# Patient Record
Sex: Female | Born: 1965 | Race: Black or African American | Hispanic: No | Marital: Single | Smoking: Never smoker
Health system: Southern US, Community
[De-identification: ages and names within clinical notes are randomized; demographics above are authoritative.]

## PROBLEM LIST (undated history)

## (undated) DIAGNOSIS — E119 Type 2 diabetes mellitus without complications: Secondary | ICD-10-CM

## (undated) DIAGNOSIS — I10 Essential (primary) hypertension: Secondary | ICD-10-CM

## (undated) HISTORY — PX: ELBOW SURGERY: SHX618

## (undated) HISTORY — PX: CARPAL TUNNEL RELEASE: SHX101

## (undated) HISTORY — PX: FINGER SURGERY: SHX640

---

## 2013-03-11 DIAGNOSIS — E119 Type 2 diabetes mellitus without complications: Secondary | ICD-10-CM | POA: Insufficient documentation

## 2013-03-11 DIAGNOSIS — L409 Psoriasis, unspecified: Secondary | ICD-10-CM | POA: Insufficient documentation

## 2013-03-11 DIAGNOSIS — I10 Essential (primary) hypertension: Secondary | ICD-10-CM | POA: Insufficient documentation

## 2016-04-19 ENCOUNTER — Other Ambulatory Visit: Payer: Self-pay | Admitting: Orthopedic Surgery

## 2016-04-19 DIAGNOSIS — M25561 Pain in right knee: Secondary | ICD-10-CM

## 2016-04-19 DIAGNOSIS — M2391 Unspecified internal derangement of right knee: Secondary | ICD-10-CM

## 2016-05-03 ENCOUNTER — Ambulatory Visit
Admission: RE | Admit: 2016-05-03 | Discharge: 2016-05-03 | Disposition: A | Discharge status: Home / Self Care | Payer: Self-pay | Source: Ambulatory Visit | Attending: Orthopedic Surgery | Admitting: Orthopedic Surgery

## 2016-05-03 ENCOUNTER — Encounter: Payer: Self-pay | Admitting: Radiology

## 2016-05-03 DIAGNOSIS — M94261 Chondromalacia, right knee: Secondary | ICD-10-CM | POA: Insufficient documentation

## 2016-05-03 DIAGNOSIS — M25561 Pain in right knee: Secondary | ICD-10-CM | POA: Insufficient documentation

## 2016-05-03 DIAGNOSIS — M2391 Unspecified internal derangement of right knee: Secondary | ICD-10-CM

## 2016-05-13 ENCOUNTER — Emergency Department
Admission: EM | Admit: 2016-05-13 | Discharge: 2016-05-13 | Disposition: A | Discharge status: Home / Self Care | Payer: Self-pay | Attending: Emergency Medicine | Admitting: Emergency Medicine

## 2016-05-13 ENCOUNTER — Encounter: Payer: Self-pay | Admitting: Emergency Medicine

## 2016-05-13 ENCOUNTER — Emergency Department: Payer: Self-pay

## 2016-05-13 DIAGNOSIS — E119 Type 2 diabetes mellitus without complications: Secondary | ICD-10-CM | POA: Insufficient documentation

## 2016-05-13 DIAGNOSIS — R519 Headache, unspecified: Secondary | ICD-10-CM

## 2016-05-13 DIAGNOSIS — R51 Headache: Secondary | ICD-10-CM

## 2016-05-13 DIAGNOSIS — I10 Essential (primary) hypertension: Secondary | ICD-10-CM | POA: Insufficient documentation

## 2016-05-13 DIAGNOSIS — G4453 Primary thunderclap headache: Secondary | ICD-10-CM | POA: Insufficient documentation

## 2016-05-13 HISTORY — DX: Essential (primary) hypertension: I10

## 2016-05-13 HISTORY — DX: Type 2 diabetes mellitus without complications: E11.9

## 2016-05-13 LAB — CSF CELL COUNT WITH DIFFERENTIAL
EOS CSF: 0 %
Eosinophils, CSF: 0 %
LYMPHS CSF: 96 %
Lymphs, CSF: 89 %
Monocyte-Macrophage-Spinal Fluid: 11 %
Monocyte-Macrophage-Spinal Fluid: 4 %
Other Cells, CSF: 0
Other Cells, CSF: 0
RBC COUNT CSF: 2 /mm3 (ref 0–3)
RBC Count, CSF: 0 /mm3 (ref 0–3)
SEGMENTED NEUTROPHILS-CSF: 0 %
SEGMENTED NEUTROPHILS-CSF: 0 %
Tube #: 1
Tube #: 4
WBC CSF: 0 /mm3 (ref 0–5)
WBC, CSF: 0 /mm3 (ref 0–5)

## 2016-05-13 LAB — GRAM STAIN: GRAM STAIN: NONE SEEN

## 2016-05-13 LAB — BASIC METABOLIC PANEL
ANION GAP: 9 (ref 5–15)
BUN: 12 mg/dL (ref 6–20)
CALCIUM: 9.4 mg/dL (ref 8.9–10.3)
CO2: 26 mmol/L (ref 22–32)
Chloride: 99 mmol/L — ABNORMAL LOW (ref 101–111)
Creatinine, Ser: 0.83 mg/dL (ref 0.44–1.00)
GFR calc Af Amer: >60 mL/min (ref >60)
GFR calc non Af Amer: >60 mL/min (ref >60)
GLUCOSE: 302 mg/dL — AB (ref 65–99)
Potassium: 4 mmol/L (ref 3.5–5.1)
Sodium: 134 mmol/L — ABNORMAL LOW (ref 135–145)

## 2016-05-13 LAB — CBC WITH DIFFERENTIAL/PLATELET
BASOS ABS: 0 10*3/uL (ref 0–0.1)
Basophils Relative: 0 %
Eosinophils Absolute: 0.2 10*3/uL (ref 0–0.7)
Eosinophils Relative: 3 %
HEMATOCRIT: 47.8 % — AB (ref 35.0–47.0)
Hemoglobin: 15.8 g/dL (ref 12.0–16.0)
LYMPHS ABS: 3.1 10*3/uL (ref 1.0–3.6)
LYMPHS PCT: 44 %
MCH: 27.4 pg (ref 26.0–34.0)
MCHC: 33 g/dL (ref 32.0–36.0)
MCV: 83 fL (ref 80.0–100.0)
MONO ABS: 0.5 10*3/uL (ref 0.2–0.9)
MONOS PCT: 8 %
NEUTROS ABS: 3.1 10*3/uL (ref 1.4–6.5)
Neutrophils Relative %: 45 %
Platelets: 292 10*3/uL (ref 150–440)
RBC: 5.76 MIL/uL — ABNORMAL HIGH (ref 3.80–5.20)
RDW: 13.7 % (ref 11.5–14.5)
WBC: 7 10*3/uL (ref 3.6–11.0)

## 2016-05-13 LAB — PROTEIN AND GLUCOSE, CSF
Glucose, CSF: 144 mg/dL — ABNORMAL HIGH (ref 40–70)
Total  Protein, CSF: 32 mg/dL (ref 15–45)

## 2016-05-13 MED ORDER — ONDANSETRON 4 MG PO TBDP
ORAL_TABLET | ORAL | Status: AC
  Filled 2016-05-13: qty 1

## 2016-05-13 MED ORDER — LIDOCAINE-EPINEPHRINE (PF) 1 %-1:200000 IJ SOLN
INTRAMUSCULAR | Status: AC
  Filled 2016-05-13: qty 30

## 2016-05-13 MED ORDER — BUTALBITAL-APAP-CAFFEINE 50-325-40 MG PO TABS
2.0000 | ORAL_TABLET | Freq: Once | ORAL | Status: AC
  Administered 2016-05-13: 2 via ORAL
  Filled 2016-05-13: qty 2

## 2016-05-13 MED ORDER — BUTALBITAL-APAP-CAFFEINE 50-325-40 MG PO TABS: 1.0000 | ORAL_TABLET | Freq: Four times a day (QID) | ORAL | 0 refills | Status: AC | PRN

## 2016-05-13 MED ORDER — ONDANSETRON 4 MG PO TBDP: 4.0000 mg | ORAL_TABLET | Freq: Once | ORAL | Status: DC

## 2016-05-13 MED ORDER — MORPHINE SULFATE (PF) 2 MG/ML IV SOLN
4.0000 mg | Freq: Once | INTRAVENOUS | Status: AC
  Administered 2016-05-13: 4 mg via INTRAVENOUS
  Filled 2016-05-13: qty 2

## 2016-05-13 MED ORDER — SODIUM CHLORIDE 0.9 % IV BOLUS (SEPSIS)
1000.0000 mL | Freq: Once | INTRAVENOUS | Status: AC
  Administered 2016-05-13: 1000 mL via INTRAVENOUS

## 2016-05-13 MED ORDER — METOCLOPRAMIDE HCL 5 MG/ML IJ SOLN
10.0000 mg | Freq: Once | INTRAMUSCULAR | Status: AC
  Administered 2016-05-13: 10 mg via INTRAVENOUS
  Filled 2016-05-13: qty 2

## 2016-05-13 NOTE — Progress Notes (Signed)
Chaplain rounded the unit to provide a compassionate presence and spiritual support to the patient and family.Family Dollar StoresChaplain Ameera Tigue [phone]

## 2016-05-13 NOTE — ED Provider Notes (Signed)
-----------------------------------------   8:10 PM on 05/13/2016 -----------------------------------------  Patient's CSF results are largely within normal limits. Patient's MRI/MRA/MRV is also normal. We will discharge the patient home with Fioricet to be taken as needed for headaches. We'll also have the patient follow up with neurology for further evaluation given her chronic headaches. Patient is agreeable to this plan.   Minna AntisKevin Tymeer Vaquera, MD 05/13/16 2011

## 2016-05-13 NOTE — ED Notes (Signed)
Patient observed resting in bed with NAD. Visitor at bedside. Patient awaiting MRI results at this time. Patient requesting to eat, however MD has not cleared her to do so at this point. Will follow up when results returned and/or disposition is decided upon. Patient with no verbalized needs at this time. Will continue to monitor.

## 2016-05-13 NOTE — ED Triage Notes (Signed)
Pt to ed with c/o headache that started after she was sitting on toilet and straining for a bm.  Pt reports pain in back of head.

## 2016-05-13 NOTE — ED Notes (Signed)

## 2016-05-13 NOTE — ED Notes (Signed)
RN to bedside to assist patient to in room toilet. Patient continues to report slight headache; continues to be slightly vertiginous with position changes. Assisted back into bed and positioned for comfort. No further verbalized needs at this time. Chaplain in to see patient at this time. MRI results just returned; MD made aware; Dr. Lenard LancePaduchowski will review shortly; patient updated. Will continue to monitor.

## 2016-05-13 NOTE — ED Notes (Signed)
Dr. Lenard LancePaduchowski in to speak with patient regarding results of MRI and POC as it stands at this point.

## 2016-05-13 NOTE — ED Provider Notes (Signed)
[facility] Emergency Department Provider Note  ____________________________________________  Time seen: Approximately 12:36 PM  I have reviewed the triage vital signs and the nursing notes.   HISTORY  Chief Complaint Headache   HPI Caitlyn Walton is a 50 y.o. female with a history of hypertension and diabetes who presents for evaluation of thunderclap headache. 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 This has happened twice in the last week. This morning she was straining in the toilet when she developed a 10 out of 10 thunderclap occipital headache. She felt she was going to pass out, felt very nauseous. She reports these headaches usually resolve within an hour without any medications however the one today did not get better. This happened around 8AM this morning. Patient reports a remote history of migraine headaches however there were never occipital in nature. She denies blurry vision, facial droop, in the lateral weakness or numbness, gait instability, slurred speech, difficulty finding words. She does have a family history of stroke in her mother. No personal or family history of AVM or aneurysms. She reports that she took 1 Excedrin at home and currently her pain is moderate. She continues to have severe nausea.   Past Medical History:  Diagnosis Date  . Diabetes mellitus without complication (HCC)   . Hypertension     There are no active problems to display for this patient.   History reviewed. No pertinent surgical history.  Prior to Admission medications   Not on File    Allergies Aleve [naproxen sodium]  History reviewed. No pertinent family history.  Social History Social History  Substance Use Topics  . Smoking status: Never Smoker  . Smokeless tobacco: Never Used  . Alcohol use Yes    Review of Systems  Constitutional: Negative for  fever. + lightheadedness Eyes: Negative for visual changes. ENT: Negative for sore throat. Cardiovascular: Negative for chest pain. Respiratory: Negative for shortness of breath. Gastrointestinal: Negative for abdominal pain, vomiting or diarrhea. + nausea Genitourinary: Negative for dysuria. Musculoskeletal: Negative for back pain. Skin: Negative for rash. Neurological: Negative for weakness or numbness. + HA  ____________________________________________   PHYSICAL EXAM:  VITAL SIGNS: ED Triage Vitals [05/13/16 0929]  Enc Vitals Group     BP (!) 142/72     Pulse      Resp 20     Temp 97.6 F (36.4 C)     Temp Source Oral     SpO2      Weight 207 lb (93.9 kg)     Height 5\' 4"  (1.626 m)     Head Circumference      Peak Flow      Pain Score 8     Pain Loc      Pain Edu?      Excl. in GC?     Constitutional: Alert and oriented. Well appearing and in no apparent distress. HEENT:      Head: Normocephalic and atraumatic.         Eyes: Conjunctivae are normal. Sclera is non-icteric. EOMI. PERRL      Mouth/Throat: Mucous membranes are moist.       Neck: Supple with no signs of meningismus. Cardiovascular: Regular rate and rhythm. No murmurs, gallops, or rubs. 2+ symmetrical distal pulses are present in all extremities. No JVD. Respiratory: Normal respiratory effort. Lungs are clear to auscultation bilaterally. No wheezes, crackles, or rhonchi.  Gastrointestinal: Soft, non tender, and non distended with positive bowel sounds. No rebound or guarding. Musculoskeletal: Nontender with normal range of motion in all extremities. No edema, cyanosis, or erythema of extremities. Neurologic: Normal speech and language. A & O x3, PERRL, no nystagmus, CN II-XII intact, motor testing reveals good tone and bulk throughout. There is no evidence of pronator drift or dysmetria. Muscle strength is 5/5 throughout. Deep tendon reflexes are 2+ throughout with downgoing toes. Sensory examination is  intact. Gait is normal. Skin: Skin is warm, dry and intact. No rash noted. Psychiatric: Mood and affect are normal. Speech and behavior are normal.  ____________________________________________   LABS (all labs ordered are listed, but only abnormal results are displayed)  Labs Reviewed  CBC WITH DIFFERENTIAL/PLATELET - Abnormal; Notable for the following:       Result Value   RBC 5.76 (*)    HCT 47.8 (*)    All other components within normal limits  BASIC METABOLIC PANEL - Abnormal; Notable for the following:    Sodium 134 (*)    Chloride 99 (*)    Glucose, Bld 302 (*)    All other components within normal limits  CSF CULTURE  GRAM STAIN  CSF CELL COUNT WITH DIFFERENTIAL  CSF CELL COUNT WITH DIFFERENTIAL  PROTEIN AND GLUCOSE, CSF   ____________________________________________  EKG  none  ____________________________________________  RADIOLOGY  Head CT: negative  ____________________________________________   PROCEDURES  Procedure(s) performed:yes .Lumbar Puncture Date/Time: 05/13/2016 2:48 PM Performed by: Nita SickleVERONESE, [place] Authorized by: Nita SickleVERONESE, [place]   Consent:    Consent obtained:  Written   Consent given by:  Patient   Risks discussed:  Bleeding, headache, nerve damage, infection, pain and repeat procedure Pre-procedure details:    Procedure purpose:  Diagnostic   Preparation: Patient was prepped and draped in usual sterile fashion   Anesthesia (see MAR for exact dosages):    Anesthesia method:  Local infiltration   Local anesthetic:  Lidocaine 1% WITH epi Procedure details:    Lumbar space:  L4-L5 interspace   Patient position:  R lateral decubitus   Needle gauge:  22   Needle type:  Spinal needle - Quincke tip   Number of attempts:  3   Fluid appearance:  Clear   Tubes of fluid:  4 Post-procedure:    Puncture site:  Adhesive bandage applied   Patient tolerance of procedure:  Tolerated well, no immediate complications   Critical Care  performed:  None ____________________________________________   INITIAL IMPRESSION / ASSESSMENT AND PLAN / ED COURSE   50 y.o. female with a history of hypertension and diabetes who presents for evaluation of thunderclap headache while straining in the toilet at 8AM. Patient fell dizzy, nauseous and felt like she was going to pass out. Patient's third episode this week of thunderclap HA with exertion. No neuro deficits noted during the onset of the pain or now in the ED. Patient reports HA is improved with excedrin she took at home but still has nausea. CT head is negative however story is very concerning for possible sentinel bleeds. I discussed risks and benefits of a spinal tap with the negative CT within 6 hours of onset of symptoms and patient would like to pursue an LP at this time. We'll get basic blood work, give her IV fluids and IV morphine for the headache, give her IV Reglan.  Clinical Course   LP performed per procedure note above. CSF studies pending. If CSF negative for blood, plan for MRV to  rule out CVT. If positive for blood will get MRI and MRV. Care transferred to Dr. Lenard Lance.  Pertinent labs & imaging results that were available during my care of the patient were reviewed by me and considered in my medical decision making (see chart for details).    ____________________________________________   FINAL CLINICAL IMPRESSION(S) / ED DIAGNOSES  Final diagnoses:  Thunderclap headache      NEW MEDICATIONS STARTED DURING THIS VISIT:  New Prescriptions   No medications on file     Note:  This document was prepared using Dragon voice recognition software and may include unintentional dictation errors.    Nita Sickle, MD 05/13/16 1450

## 2016-05-17 LAB — CSF CULTURE W GRAM STAIN
Culture: NO GROWTH
Gram Stain: NONE SEEN

## 2016-05-17 LAB — PATHOLOGIST SMEAR REVIEW

## 2016-05-17 LAB — CSF CULTURE

## 2017-03-23 ENCOUNTER — Encounter: Payer: Self-pay | Admitting: Emergency Medicine

## 2017-03-23 ENCOUNTER — Emergency Department
Admission: EM | Admit: 2017-03-23 | Discharge: 2017-03-23 | Disposition: A | Discharge status: Home / Self Care | Payer: 59 | Attending: Emergency Medicine | Admitting: Emergency Medicine

## 2017-03-23 ENCOUNTER — Emergency Department: Payer: 59

## 2017-03-23 DIAGNOSIS — Z794 Long term (current) use of insulin: Secondary | ICD-10-CM | POA: Diagnosis not present

## 2017-03-23 DIAGNOSIS — R079 Chest pain, unspecified: Secondary | ICD-10-CM | POA: Insufficient documentation

## 2017-03-23 DIAGNOSIS — E119 Type 2 diabetes mellitus without complications: Secondary | ICD-10-CM | POA: Diagnosis not present

## 2017-03-23 DIAGNOSIS — I10 Essential (primary) hypertension: Secondary | ICD-10-CM | POA: Insufficient documentation

## 2017-03-23 DIAGNOSIS — Z91040 Latex allergy status: Secondary | ICD-10-CM | POA: Insufficient documentation

## 2017-03-23 DIAGNOSIS — R6884 Jaw pain: Secondary | ICD-10-CM | POA: Diagnosis present

## 2017-03-23 DIAGNOSIS — M549 Dorsalgia, unspecified: Secondary | ICD-10-CM | POA: Diagnosis not present

## 2017-03-23 LAB — TROPONIN I: Troponin I: <0.03 ng/mL (ref <0.03)

## 2017-03-23 LAB — CBC
HEMATOCRIT: 43.2 % (ref 35.0–47.0)
Hemoglobin: 14.4 g/dL (ref 12.0–16.0)
MCH: 27.3 pg (ref 26.0–34.0)
MCHC: 33.5 g/dL (ref 32.0–36.0)
MCV: 81.6 fL (ref 80.0–100.0)
PLATELETS: 306 10*3/uL (ref 150–440)
RBC: 5.29 MIL/uL — AB (ref 3.80–5.20)
RDW: 13.6 % (ref 11.5–14.5)
WBC: 8.9 10*3/uL (ref 3.6–11.0)

## 2017-03-23 LAB — BASIC METABOLIC PANEL
ANION GAP: 9 (ref 5–15)
BUN: 11 mg/dL (ref 6–20)
CHLORIDE: 100 mmol/L — AB (ref 101–111)
CO2: 26 mmol/L (ref 22–32)
Calcium: 9.2 mg/dL (ref 8.9–10.3)
Creatinine, Ser: 0.74 mg/dL (ref 0.44–1.00)
GFR calc Af Amer: >60 mL/min (ref >60)
GFR calc non Af Amer: >60 mL/min (ref >60)
Glucose, Bld: 301 mg/dL — ABNORMAL HIGH (ref 65–99)
POTASSIUM: 3.6 mmol/L (ref 3.5–5.1)
SODIUM: 135 mmol/L (ref 135–145)

## 2017-03-23 MED ORDER — IOPAMIDOL (ISOVUE-370) INJECTION 76%
100.0000 mL | Freq: Once | INTRAVENOUS | Status: AC | PRN
  Administered 2017-03-23: 100 mL via INTRAVENOUS

## 2017-03-23 NOTE — ED Notes (Signed)
Patient transported to CT 

## 2017-03-23 NOTE — ED Notes (Signed)
ED Provider at bedside. 

## 2017-03-23 NOTE — ED Provider Notes (Signed)
[facility] Emergency Department Provider Note   ____________________________________________    I have reviewed the triage vital signs and the nursing notes.   HISTORY  Chief Complaint Jaw Pain; Back Pain; and Chest Pain     HPI Caitlyn Walton is a 51 y.o. female who presents with left-sided jaw pain for several days. This morning when she woke up she felt tightness across her shoulders bilaterally as well she became anxious and also had approximately 15-20 minutes of chest tightness. She reports her chest tightness has resolved now. No shortness of breath. No history of the same. She did not take anything for this. No diaphoresis nausea or vomiting. She does have a history of diabetes. She reports she thinks her cholesterol is normal. She does not smoke.   Past Medical History:  Diagnosis Date  . Diabetes mellitus without complication (HCC)   . Hypertension     There are no active problems to display for this patient.   History reviewed. No pertinent surgical history.  Prior to Admission medications   Medication Sig Start Date End Date Taking? Authorizing Provider  glipiZIDE (GLUCOTROL XL) 10 MG 24 hr tablet Take 20 mg by mouth daily. 02/12/17  Yes [provider]  hydrochlorothiazide (HYDRODIURIL) 25 MG tablet Take 25 mg by mouth daily. 01/30/17  Yes [provider]  Insulin Glargine (BASAGLAR KWIKPEN [place]) Inject 40 Units into the skin at bedtime.   Yes [provider]  losartan (COZAAR) 100 MG tablet Take 100 mg by mouth daily. 01/30/17  Yes [provider]  butalbital-acetaminophen-caffeine (FIORICET, ESGIC) 50-325-40 MG tablet Take 1-2 tablets by mouth every 6 (six) hours as needed for headache. Patient not taking: Reported on 03/23/2017 05/13/16 05/13/17  Minna AntisPaduchowski, Kevin, MD     Allergies Aleve [naproxen sodium] and Latex  No family history on file.  Social History Social History  Substance Use  Topics  . Smoking status: Never Smoker  . Smokeless tobacco: Never Used  . Alcohol use Yes    Review of Systems  Constitutional: No fever/chills Eyes: No visual changes.  ENT: As above Cardiovascular:As above Respiratory: Denies shortness of breath. Gastrointestinal: No abdominal pain.  No nausea, no vomiting.   Genitourinary: Negative for dysuria. Musculoskeletal: Negative for back pain. Skin: Negative for rash. Neurological: Negative for headaches    ____________________________________________   PHYSICAL EXAM:  VITAL SIGNS: ED Triage Vitals  Enc Vitals Group     BP 03/23/17 0838 121/80     Pulse Rate 03/23/17 0851 81     Resp 03/23/17 0838 20     Temp 03/23/17 0838 97.9 F (36.6 C)     Temp Source 03/23/17 0838 Oral     SpO2 03/23/17 0838 98 %     Weight --      Height --      Head Circumference --      Peak Flow --      Pain Score 03/23/17 0838 4     Pain Loc --      Pain Edu? --      Excl. in GC? --     Constitutional: Alert and oriented. No acute distress. Pleasant and interactive Eyes: Conjunctivae are normal.   Nose: No congestion/rhinnorhea. Mouth/Throat: Mucous membranes are moist. Normal intraoral exam, normal jaw exam, no swollen glands or tenderness.  Cardiovascular: Normal rate, regular rhythm. Grossly normal heart sounds.  Good peripheral circulation. Respiratory: Normal respiratory effort.  No retractions. Lungs CTAB. Gastrointestinal: Soft and nontender. No  distention.  No CVA tenderness. Genitourinary: deferred Musculoskeletal:.  Warm and well perfused Neurologic:  Normal speech and language. No gross focal neurologic deficits are appreciated.  Skin:  Skin is warm, dry and intact. No rash noted. Psychiatric: Mood and affect are normal. Speech and behavior are normal.  ____________________________________________   LABS (all labs ordered are listed, but only abnormal results are displayed)  Labs Reviewed  BASIC METABOLIC PANEL -  Abnormal; Notable for the following:       Result Value   Chloride 100 (*)    Glucose, Bld 301 (*)    All other components within normal limits  CBC - Abnormal; Notable for the following:    RBC 5.29 (*)    All other components within normal limits  TROPONIN I   ____________________________________________  EKG  ED ECG REPORT I, Jene Every, the attending physician, personally viewed and interpreted this ECG.  Date: 03/23/2017  Rhythm: normal sinus rhythm QRS Axis: normal Intervals: normal ST/T Wave abnormalities: normal Narrative Interpretation: no evidence of acute ischemia  ____________________________________________  RADIOLOGY  Chest x-ray unremarkable, CT angiography unremarkable ____________________________________________   PROCEDURES  Procedure(s) performed: No    Critical Care performed:No ____________________________________________   INITIAL IMPRESSION / ASSESSMENT AND PLAN / ED COURSE  Pertinent labs & imaging results that were available during my care of the patient were reviewed by me and considered in my medical decision making (see chart for details).  Patient well appearing with reassuring labs. EKG was unremarkable. Chest x-ray was normal, CT angiography was performed given her description of jaw chest and back discomfort that was normal. Given that her chest pain only lasted 10-15 minutes and she feels quite well now with normal labs and a reassuring workup feel outpatient follow-up with cardiology as appropriate. Patient agrees to this plan. She knows to return if any recurrence of her pain.    ____________________________________________   FINAL CLINICAL IMPRESSION(S) / ED DIAGNOSES  Final diagnoses:  Chest pain, unspecified type      NEW MEDICATIONS STARTED DURING THIS VISIT:  Discharge Medication List as of 03/23/2017 11:46 AM       Note:  This document was prepared using Dragon voice recognition software and may include  unintentional dictation errors.    Jene Every, MD 03/23/17 [phone]

## 2017-03-23 NOTE — ED Triage Notes (Signed)
Pt with chest discomfort, mid back pain and jaw pain.

## 2017-05-08 ENCOUNTER — Other Ambulatory Visit: Payer: Self-pay | Admitting: Obstetrics and Gynecology

## 2017-05-08 DIAGNOSIS — Z1231 Encounter for screening mammogram for malignant neoplasm of breast: Secondary | ICD-10-CM

## 2017-06-05 ENCOUNTER — Ambulatory Visit
Admission: RE | Admit: 2017-06-05 | Discharge: 2017-06-05 | Disposition: A | Discharge status: Home / Self Care | Payer: 59 | Source: Ambulatory Visit | Attending: Obstetrics and Gynecology | Admitting: Obstetrics and Gynecology

## 2017-06-05 DIAGNOSIS — Z1231 Encounter for screening mammogram for malignant neoplasm of breast: Secondary | ICD-10-CM | POA: Diagnosis not present

## 2017-09-14 ENCOUNTER — Encounter: Payer: Self-pay | Admitting: Emergency Medicine

## 2017-09-14 ENCOUNTER — Emergency Department
Admission: EM | Admit: 2017-09-14 | Discharge: 2017-09-14 | Disposition: A | Discharge status: Home / Self Care | Payer: 59 | Attending: Emergency Medicine | Admitting: Emergency Medicine

## 2017-09-14 ENCOUNTER — Emergency Department: Payer: 59

## 2017-09-14 ENCOUNTER — Other Ambulatory Visit: Payer: Self-pay

## 2017-09-14 DIAGNOSIS — R110 Nausea: Secondary | ICD-10-CM | POA: Insufficient documentation

## 2017-09-14 DIAGNOSIS — R197 Diarrhea, unspecified: Secondary | ICD-10-CM | POA: Insufficient documentation

## 2017-09-14 DIAGNOSIS — I10 Essential (primary) hypertension: Secondary | ICD-10-CM | POA: Insufficient documentation

## 2017-09-14 DIAGNOSIS — K5900 Constipation, unspecified: Secondary | ICD-10-CM

## 2017-09-14 DIAGNOSIS — R1031 Right lower quadrant pain: Secondary | ICD-10-CM | POA: Diagnosis not present

## 2017-09-14 DIAGNOSIS — Z794 Long term (current) use of insulin: Secondary | ICD-10-CM | POA: Insufficient documentation

## 2017-09-14 DIAGNOSIS — Z79899 Other long term (current) drug therapy: Secondary | ICD-10-CM | POA: Insufficient documentation

## 2017-09-14 DIAGNOSIS — E119 Type 2 diabetes mellitus without complications: Secondary | ICD-10-CM | POA: Diagnosis not present

## 2017-09-14 DIAGNOSIS — R1030 Lower abdominal pain, unspecified: Secondary | ICD-10-CM

## 2017-09-14 LAB — URINALYSIS, COMPLETE (UACMP) WITH MICROSCOPIC
Bacteria, UA: NONE SEEN
Bilirubin Urine: NEGATIVE
HGB URINE DIPSTICK: NEGATIVE
Ketones, ur: NEGATIVE mg/dL
Leukocytes, UA: NEGATIVE
NITRITE: NEGATIVE
PH: 5 (ref 5.0–8.0)
PROTEIN: NEGATIVE mg/dL
SPECIFIC GRAVITY, URINE: 1.033 — AB (ref 1.005–1.030)

## 2017-09-14 LAB — COMPREHENSIVE METABOLIC PANEL
ALBUMIN: 3.8 g/dL (ref 3.5–5.0)
ALT: 22 U/L (ref 14–54)
ANION GAP: 10 (ref 5–15)
AST: 33 U/L (ref 15–41)
Alkaline Phosphatase: 57 U/L (ref 38–126)
BILIRUBIN TOTAL: 0.6 mg/dL (ref 0.3–1.2)
BUN: 12 mg/dL (ref 6–20)
CO2: 23 mmol/L (ref 22–32)
CREATININE: 0.71 mg/dL (ref 0.44–1.00)
Calcium: 8.9 mg/dL (ref 8.9–10.3)
Chloride: 103 mmol/L (ref 101–111)
GFR calc Af Amer: >60 mL/min (ref >60)
GFR calc non Af Amer: >60 mL/min (ref >60)
GLUCOSE: 385 mg/dL — AB (ref 65–99)
Potassium: 3.8 mmol/L (ref 3.5–5.1)
Sodium: 136 mmol/L (ref 135–145)
TOTAL PROTEIN: 6.7 g/dL (ref 6.5–8.1)

## 2017-09-14 LAB — CBC
HCT: 42.1 % (ref 35.0–47.0)
HEMOGLOBIN: 13.8 g/dL (ref 12.0–16.0)
MCH: 27.4 pg (ref 26.0–34.0)
MCHC: 32.7 g/dL (ref 32.0–36.0)
MCV: 83.8 fL (ref 80.0–100.0)
Platelets: 293 10*3/uL (ref 150–440)
RBC: 5.03 MIL/uL (ref 3.80–5.20)
RDW: 13.1 % (ref 11.5–14.5)
WBC: 9.7 10*3/uL (ref 3.6–11.0)

## 2017-09-14 LAB — LIPASE, BLOOD: Lipase: 45 U/L (ref 11–51)

## 2017-09-14 MED ORDER — POLYETHYLENE GLYCOL 3350 17 G PO PACK: 17.0000 g | PACK | Freq: Every day | ORAL | 0 refills | Status: AC | PRN

## 2017-09-14 MED ORDER — IOPAMIDOL (ISOVUE-300) INJECTION 61%
100.0000 mL | Freq: Once | INTRAVENOUS | Status: AC | PRN
  Administered 2017-09-14: 100 mL via INTRAVENOUS

## 2017-09-14 MED ORDER — ONDANSETRON HCL 4 MG/2ML IJ SOLN
4.0000 mg | Freq: Once | INTRAMUSCULAR | Status: AC
  Administered 2017-09-14: 4 mg via INTRAVENOUS
  Filled 2017-09-14: qty 2

## 2017-09-14 MED ORDER — SODIUM CHLORIDE 0.9 % IV BOLUS (SEPSIS)
1000.0000 mL | Freq: Once | INTRAVENOUS | Status: AC
  Administered 2017-09-14: 1000 mL via INTRAVENOUS

## 2017-09-14 NOTE — ED Notes (Signed)
Notified CT that patient is ready.

## 2017-09-14 NOTE — ED Provider Notes (Signed)
[facility] Emergency Department Provider Note  ____________________________________________   First MD Initiated Contact with Patient 09/14/17 [phone]     (approximate)  I have reviewed the triage vital signs and the nursing notes.   HISTORY  Chief Complaint Abdominal Pain   HPI Caitlyn Walton is a 52 y.o. female with a history of diabetes and hypertension was presenting to the emergency department with periumbilical pain that is radiating to the right lower quadrant.  Says the pain is been ongoing over the past week and is worsening gradually.  She says the pain is a 7 out of 10 now with sharp, shooting pain to the right lower quadrant.  Says that she has nausea but no vomiting.  Says that about a week ago just prior to this pain she had had diarrhea and now is having pellet-like stools but is able to pass stool and had a last bowel movement just this morning.  She says the pain is relieved with the passage of stool briefly but then quickly returns.  History of a C-section but without any other abdominal surgeries.  Past Medical History:  Diagnosis Date  . Diabetes mellitus without complication (HCC)   . Hypertension     There are no active problems to display for this patient.   Past Surgical History:  Procedure Laterality Date  . CARPAL TUNNEL RELEASE    . CESAREAN SECTION    . ELBOW SURGERY    . FINGER SURGERY      Prior to Admission medications   Medication Sig Start Date End Date Taking? Authorizing Provider  glipiZIDE (GLUCOTROL XL) 10 MG 24 hr tablet Take 20 mg by mouth daily. 02/12/17   [provider]  hydrochlorothiazide (HYDRODIURIL) 25 MG tablet Take 25 mg by mouth daily. 01/30/17   [provider]  Insulin Glargine (BASAGLAR KWIKPEN [place]) Inject 40 Units into the skin at bedtime.    [provider]  losartan (COZAAR) 100 MG tablet Take 100 mg by mouth daily. 01/30/17   [provider]     Allergies Aleve [naproxen sodium] and Latex  Family History  Problem Relation Age of Onset  . Breast cancer Neg Hx     Social History Social History   Tobacco Use  . Smoking status: Never Smoker  . Smokeless tobacco: Never Used  Substance Use Topics  . Alcohol use: Yes  . Drug use: No    Review of Systems  Constitutional: No fever/chills Eyes: No visual changes. ENT: No sore throat. Cardiovascular: Denies chest pain. Respiratory: Denies shortness of breath. Gastrointestinal: no vomiting.   Genitourinary: Negative for dysuria. Musculoskeletal: Negative for back pain. Skin: Negative for rash. Neurological: Negative for headaches, focal weakness or numbness.   ____________________________________________   PHYSICAL EXAM:  VITAL SIGNS: ED Triage Vitals  Enc Vitals Group     BP 09/14/17 0120 (!) 141/32     Pulse Rate 09/14/17 0120 88     Resp 09/14/17 0120 18     Temp 09/14/17 0120 98.4 F (36.9 C)     Temp Source 09/14/17 0120 Oral     SpO2 09/14/17 0120 97 %     Weight 09/14/17 0113 203 lb (92.1 kg)     Height 09/14/17 0113 5\' 4"  (1.626 m)     Head Circumference --      Peak Flow --      Pain Score 09/14/17 0113 0     Pain Loc --      Pain Edu? --  Excl. in GC? --     Constitutional: Alert and oriented. Well appearing and in no acute distress. Eyes: Conjunctivae are normal.  Head: Atraumatic. Nose: No congestion/rhinnorhea. Mouth/Throat: Mucous membranes are moist.  Neck: No stridor.   Cardiovascular: Normal rate, regular rhythm. Grossly normal heart sounds.  Respiratory: Normal respiratory effort.  No retractions. Lungs CTAB. Gastrointestinal: Soft with periumbilical as well as right lower quadrant tenderness without rebound or guarding.  Tenderness is moderate. No distention.  Musculoskeletal: No lower extremity tenderness nor edema.  No joint effusions. Neurologic:  Normal speech and language. No gross focal neurologic deficits are  appreciated. Skin:  Skin is warm, dry and intact. No rash noted. Psychiatric: Mood and affect are normal. Speech and behavior are normal.  ____________________________________________   LABS (all labs ordered are listed, but only abnormal results are displayed)  Labs Reviewed  COMPREHENSIVE METABOLIC PANEL - Abnormal; Notable for the following components:      Result Value   Glucose, Bld 385 (*)    All other components within normal limits  URINALYSIS, COMPLETE (UACMP) WITH MICROSCOPIC - Abnormal; Notable for the following components:   Color, Urine STRAW (*)    APPearance CLEAR (*)    Specific Gravity, Urine 1.033 (*)    Glucose, UA >=500 (*)    Squamous Epithelial / LPF 0-5 (*)    All other components within normal limits  LIPASE, BLOOD  CBC   ____________________________________________  EKG   ____________________________________________  RADIOLOGY  No intra-abdominal or pelvic finding on CAT scan ____________________________________________   PROCEDURES  Procedure(s) performed:   Procedures  Critical Care performed:   ____________________________________________   INITIAL IMPRESSION / ASSESSMENT AND PLAN / ED COURSE  Pertinent labs & imaging results that were available during my care of the patient were reviewed by me and considered in my medical decision making (see chart for details).  Differential diagnosis includes, but is not limited to, ovarian cyst, ovarian torsion, acute appendicitis, diverticulitis, urinary tract infection/pyelonephritis, endometriosis, bowel obstruction, colitis, renal colic, gastroenteritis, hernia, fibroids, endometriosis, pregnancy related pain including ectopic pregnancy, etc. As part of my medical decision making, I reviewed the following data within the electronic MEDICAL RECORD NUMBER Notes from prior ED visits  ----------------------------------------- 9:20 AM on 09/14/2017 -----------------------------------------  Patient  continues to rest comfortably at this time.  Reassuring CAT scan.  Possibly constipation causing her abdominal pain.  Will discharge her on MiraLAX.  She is understanding of the diagnosis as well as treatment plan and willing to comply.   ____________________________________________   FINAL CLINICAL IMPRESSION(S) / ED DIAGNOSES  Lower abdominal pain.  Constipation.    NEW MEDICATIONS STARTED DURING THIS VISIT:  New Prescriptions   No medications on file     Note:  This document was prepared using Dragon voice recognition software and may include unintentional dictation errors.     Myrna BlazerSchaevitz, David Matthew, MD 09/14/17 [phone]

## 2017-09-14 NOTE — ED Notes (Signed)

## 2017-09-14 NOTE — ED Triage Notes (Addendum)
Pt presents to ED with abd pain with nausea and difficulty having a bowel movement. Pt states her pain is "mostly around the belly button and sometimes shoots down" to her right lower abd. Denies vomiting. Onset approx 1 week ago but has gotten progressively worse.

## 2018-07-08 DIAGNOSIS — E1142 Type 2 diabetes mellitus with diabetic polyneuropathy: Secondary | ICD-10-CM | POA: Insufficient documentation

## 2018-07-08 DIAGNOSIS — Z6831 Body mass index (BMI) 31.0-31.9, adult: Secondary | ICD-10-CM | POA: Insufficient documentation

## 2018-09-02 IMAGING — MG MM DIGITAL SCREENING BILAT W/ CAD
6 series · 6 of 6 positions shown · non-contrast
Comparison: Previous exam(s).

CLINICAL DATA: Screening.

EXAM:
DIGITAL SCREENING BILATERAL MAMMOGRAM WITH CAD

[R CC (1 of 2)]
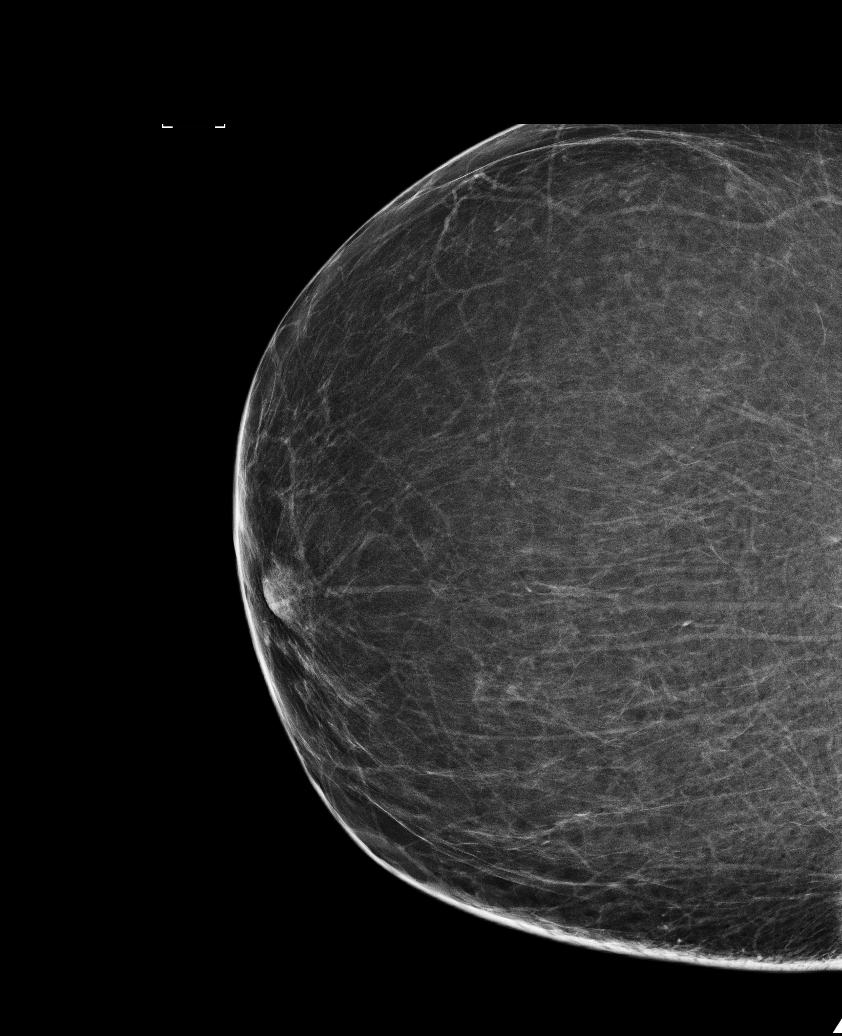

[R CC (2 of 2)]
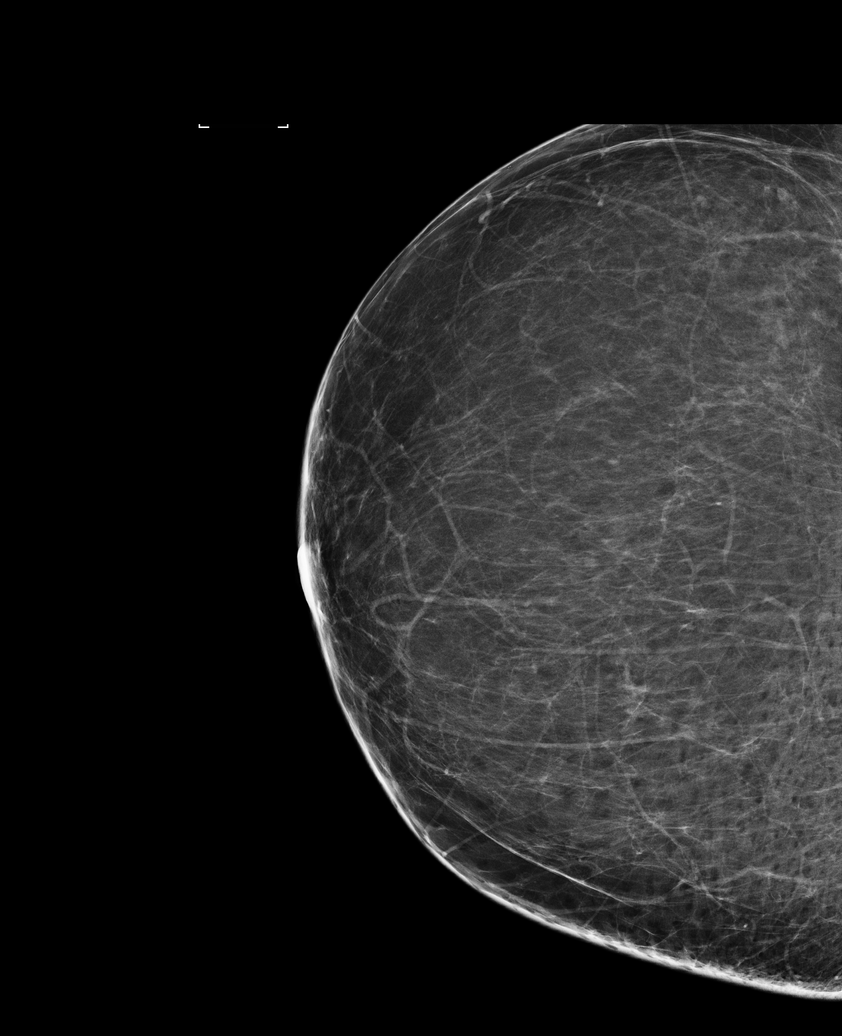

[L CC (1 of 2)]
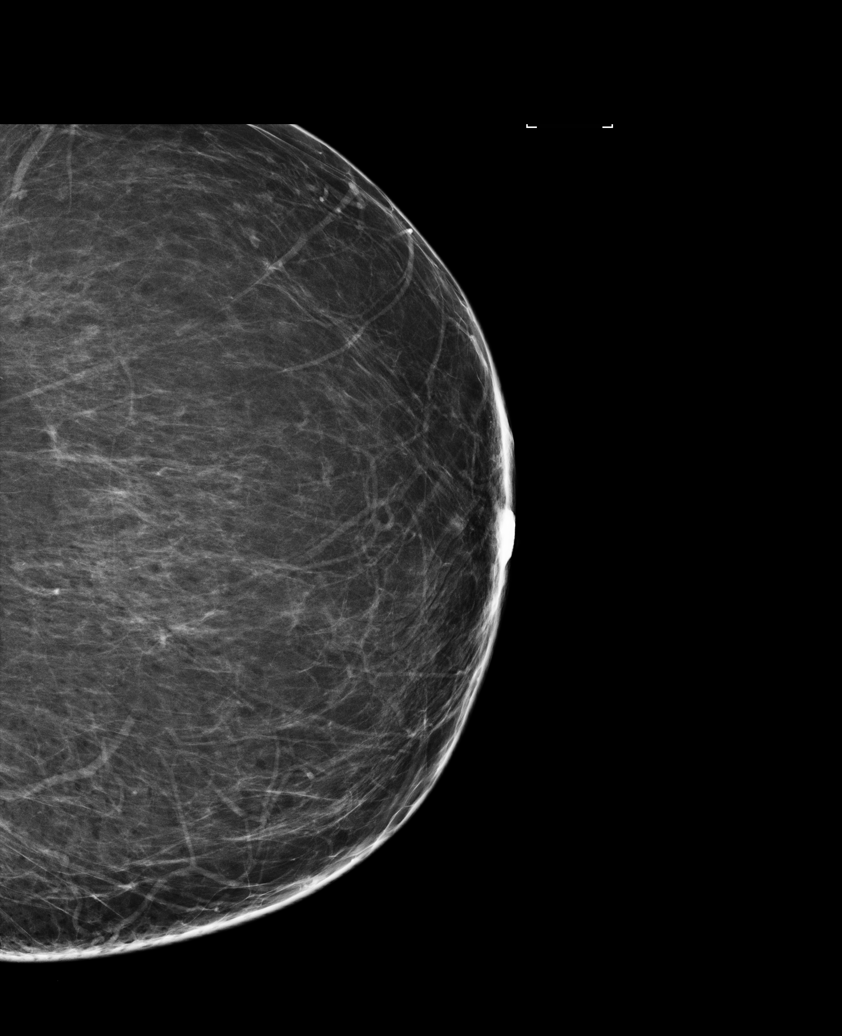

[L MLO]
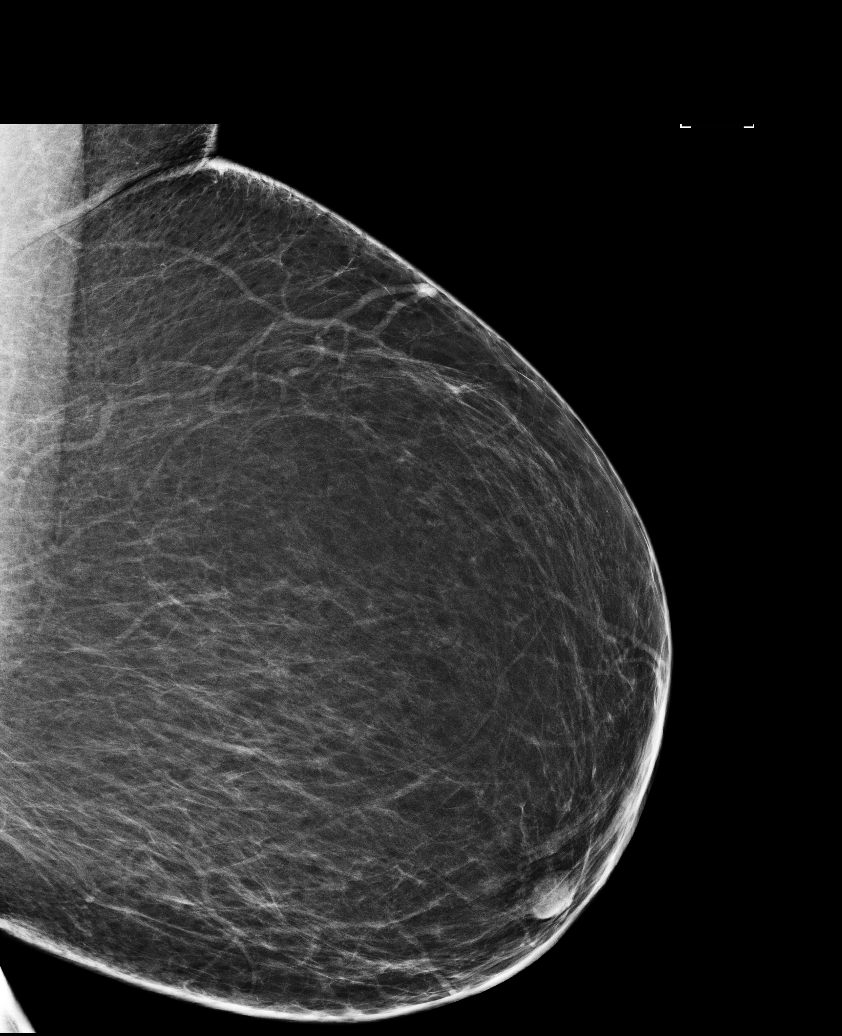

[L CC (2 of 2)]
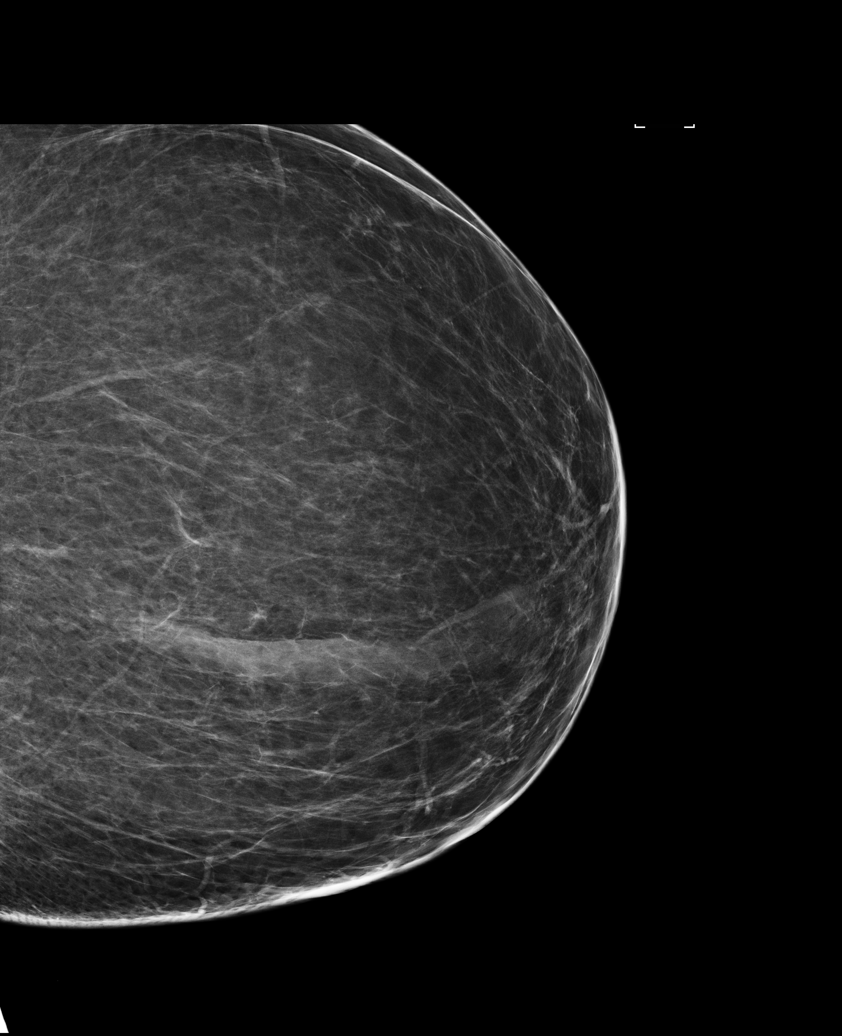

[R MLO]
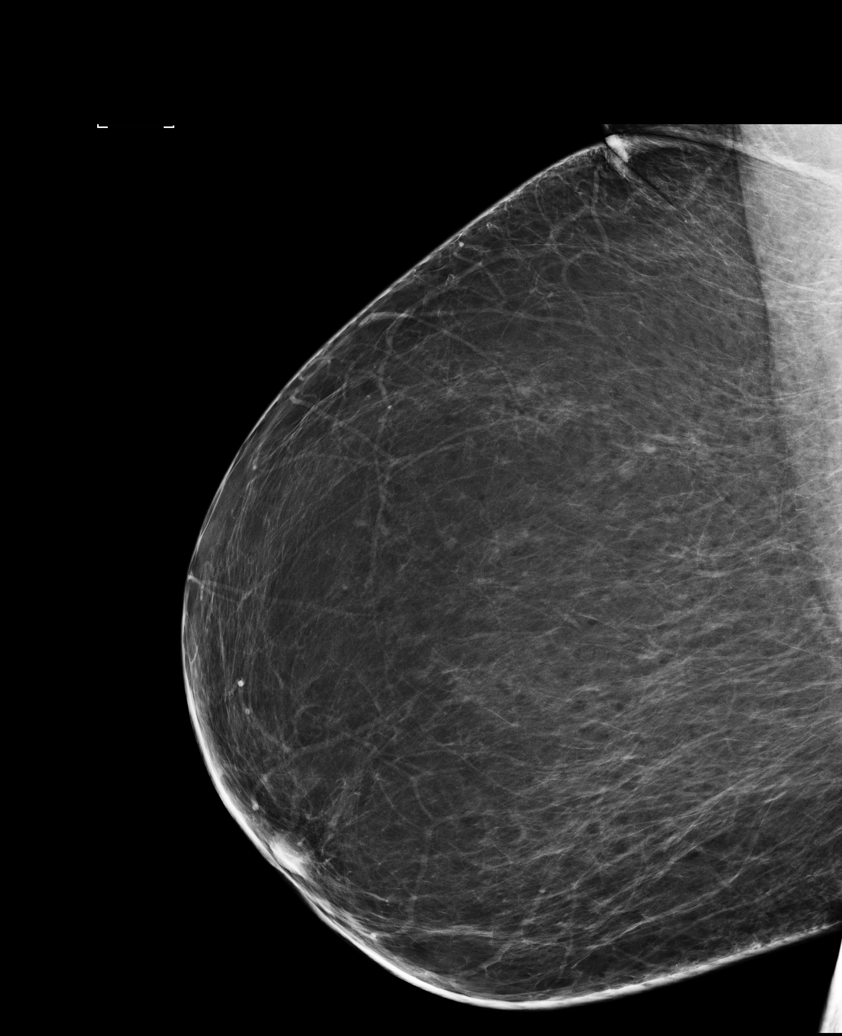

[6 of 6 positions shown; findings below may reference images not displayed]

ACR Breast Density Category b: There are scattered areas of
fibroglandular density.
FINDINGS: There are no findings suspicious for malignancy. Images were
processed with CAD.
IMPRESSION: No mammographic evidence of malignancy. A result letter of this
screening mammogram will be mailed directly to the patient.

RECOMMENDATION:
Screening mammogram in one year. (Code:AS-G-LCT)

BI-RADS CATEGORY  1: Negative.

## 2019-03-19 ENCOUNTER — Other Ambulatory Visit: Payer: Self-pay | Admitting: Certified Nurse Midwife

## 2019-03-19 DIAGNOSIS — Z1231 Encounter for screening mammogram for malignant neoplasm of breast: Secondary | ICD-10-CM

## 2019-04-07 ENCOUNTER — Ambulatory Visit
Admission: RE | Admit: 2019-04-07 | Discharge: 2019-04-07 | Disposition: A | Discharge status: Home / Self Care | Payer: 59 | Source: Ambulatory Visit | Attending: Certified Nurse Midwife | Admitting: Certified Nurse Midwife

## 2019-04-07 ENCOUNTER — Encounter (INDEPENDENT_AMBULATORY_CARE_PROVIDER_SITE_OTHER): Payer: Self-pay

## 2019-04-07 ENCOUNTER — Other Ambulatory Visit: Payer: Self-pay

## 2019-04-07 DIAGNOSIS — Z1231 Encounter for screening mammogram for malignant neoplasm of breast: Secondary | ICD-10-CM | POA: Diagnosis not present

## 2019-04-16 ENCOUNTER — Ambulatory Visit
Admission: EM | Admit: 2019-04-16 | Discharge: 2019-04-16 | Disposition: A | Discharge status: Home / Self Care | Payer: 59 | Attending: Urgent Care | Admitting: Urgent Care

## 2019-04-16 ENCOUNTER — Encounter: Payer: Self-pay | Admitting: Emergency Medicine

## 2019-04-16 ENCOUNTER — Other Ambulatory Visit: Payer: Self-pay

## 2019-04-16 DIAGNOSIS — H6690 Otitis media, unspecified, unspecified ear: Secondary | ICD-10-CM

## 2019-04-16 MED ORDER — FLUCONAZOLE 150 MG PO TABS: ORAL_TABLET | ORAL | 0 refills | Status: DC

## 2019-04-16 MED ORDER — AMOXICILLIN-POT CLAVULANATE 875-125 MG PO TABS: 1.0000 | ORAL_TABLET | Freq: Two times a day (BID) | ORAL | 0 refills | Status: AC

## 2019-04-16 NOTE — ED Provider Notes (Signed)
[place], [place]   Name: Caitlyn Walton DOB: Jun 07, 1966 MRN: [id number] CSN: [id number] PCP: Marlene Bastarey, Timothy S, MD  Arrival date and time:  04/16/19 0825  Chief Complaint:  Otalgia (APPT)   NOTE: Prior to seeing the patient today, I have reviewed the triage nursing documentation and vital signs. Clinical staff has updated patient's PMH/PSHx, current medication list, and drug allergies/intolerances to ensure comprehensive history available to assist in medical decision making.   History:   HPI: Caitlyn Walton is a 53 y.o. female who presents today with complaints of pain in her RIGHT ear. Pain began with acute onset about 3 days ago. She denies any associated fevers. Patient has not had any other recent upper respiratory symptoms; no cough, congestion, rhinorrhea, sneezing, or sore throat. She denies forceful nose blowing. Patient has not appreciated any otorrhea. She advises that her ability to hear from the RIGHT ear has acutely changed with the onset of the pain; describes hearing as being "like she is under water". Patient denies history of recurrent ear infections. She has never had tympanostomy tubes in the past. Patient advising that she has not been swimming in the recent past. Patient advises that her pain is generally worse in the morning after she has gone all night without taking anything for the pain. In efforts to conservatively manage her symptoms at home, the patient notes that she has used Aleve, which has helped to improve her symptoms.   Past Medical History:  Diagnosis Date  . Diabetes mellitus without complication (HCC)   . Hypertension     Past Surgical History:  Procedure Laterality Date  . CARPAL TUNNEL RELEASE    . CESAREAN SECTION    . ELBOW SURGERY    . FINGER SURGERY      Family History  Problem Relation Age of Onset  . Breast cancer Neg Hx     Social History   Tobacco Use  . Smoking status: Never Smoker  . Smokeless tobacco: Never Used   Substance Use Topics  . Alcohol use: Yes  . Drug use: No    There are no active problems to display for this patient.   Home Medications:    Current Meds  Medication Sig  . glipiZIDE (GLUCOTROL XL) 10 MG 24 hr tablet Take 20 mg by mouth daily.  . hydrochlorothiazide (HYDRODIURIL) 25 MG tablet Take 25 mg by mouth daily.  . Insulin Glargine (BASAGLAR KWIKPEN [place]) Inject 40 Units into the skin at bedtime.  Marland Kitchen. losartan (COZAAR) 100 MG tablet Take 100 mg by mouth daily.  . polyethylene glycol (MIRALAX) packet Take 17 g by mouth daily as needed for mild constipation or moderate constipation.    Allergies:   Aleve [naproxen sodium] and Latex  Review of Systems (ROS): Review of Systems  Constitutional: Negative for chills and fever.  HENT: Positive for ear pain and postnasal drip (clear). Negative for congestion, ear discharge, facial swelling, rhinorrhea, sinus pressure, sinus pain and sore throat.   Respiratory: Negative for cough and shortness of breath.   Cardiovascular: Negative for chest pain and palpitations.  Gastrointestinal: Negative for nausea and vomiting.  Musculoskeletal: Negative for myalgias, neck pain and neck stiffness.  Skin: Negative for color change, pallor and rash.  Neurological: Negative for dizziness, syncope, weakness and headaches.  Hematological: Positive for adenopathy (RIGHT preauricular).  All other systems reviewed and are negative.    Vital Signs: Today's Vitals   04/16/19 0837 04/16/19 0838  BP: (!) 142/93   Pulse: 82  Resp: 18   Temp: 98.4 F (36.9 C)   TempSrc: Oral   SpO2: 100%   Weight:  180 lb (81.6 kg)  Height:  5\' 4"  (1.626 m)  PainSc: 5      Physical Exam: Physical Exam  Constitutional: She is oriented to person, place, and time and well-developed, well-nourished, and in no distress. No distress.  HENT:  Head: Normocephalic and atraumatic.  Right Ear: There is tenderness. No drainage or swelling. Tympanic membrane is  erythematous and bulging.  Left Ear: No tenderness. Tympanic membrane is injected. Tympanic membrane is not erythematous and not bulging.  Nose: Nose normal.  Mouth/Throat: Posterior oropharyngeal erythema (+) clear PND present.  Eyes: Pupils are equal, round, and reactive to light. Conjunctivae and EOM are normal.  Neck: Normal range of motion. Neck supple. No tracheal deviation present.  Cardiovascular: Normal rate, regular rhythm, normal heart sounds and intact distal pulses. Exam reveals no gallop and no friction rub.  No murmur heard. Pulmonary/Chest: Effort normal and breath sounds normal. No respiratory distress. She has no wheezes. She has no rales.  Musculoskeletal: Normal range of motion.  Lymphadenopathy:       Head (right side): Submandibular and preauricular adenopathy present.    She has no cervical adenopathy.  Neurological: She is alert and oriented to person, place, and time. Gait normal.  Skin: Skin is warm and dry. No rash noted. She is not diaphoretic.  Psychiatric: Mood, memory, affect and judgment normal.  Nursing note and vitals reviewed.   Urgent Care Treatments / Results:   LABS: PLEASE NOTE: all labs that were ordered this encounter are listed, however only abnormal results are displayed. Labs Reviewed - No data to display  EKG: -None  RADIOLOGY: No results found.  PROCEDURES: Procedures  MEDICATIONS RECEIVED THIS VISIT: Medications - No data to display  PERTINENT CLINICAL COURSE NOTES/UPDATES:   Initial Impression / Assessment and Plan / Urgent Care Course:  Pertinent labs & imaging results that were available during my care of the patient were personally reviewed by me and considered in my medical decision making (see lab/imaging section of note for values and interpretations).  Meryem Haertel is a 53 y.o. female who presents to [facility] today with complaints of pain in her RIGHT ear.   Patient is well appearing overall in clinic  today. She does not appear to be in any acute distress. Presenting symptoms (see HPI) and exam as documented above. Symptoms and physical exam consistent with AOM in her RIGHT ear. No fevers. (+) increasing pain and changes to her hearing. Will cover with a 7 day course of Augmentin. Discussed supportive care measures at home during acute phase of illness. Patient to rest as much as possible. She was encouraged to ensure adequate hydration (water and ORS). Patient may use APAP and/or IBU on an as needed basis for pain/fever. Patient reporting that she is prone to developing vulvovaginal candidiasis associated with antimicrobial therapy. Will send in prescription for prophylactic oral fluconazole for PRN use.    Discussed follow up with primary care physician in 1 week for re-evaluation. I have reviewed the follow up and strict return precautions for any new or worsening symptoms. Patient is aware of symptoms that would be deemed urgent/emergent, and would thus require further evaluation either here or in the emergency department. At the time of discharge, she verbalized understanding and consent with the discharge plan as it was reviewed with her. All questions were fielded by provider and/or clinic staff  prior to patient discharge.    Final Clinical Impressions / Urgent Care Diagnoses:   Final diagnoses:  Acute otitis media, unspecified otitis media type    New Prescriptions:   Controlled Substance Registry consulted? Not Applicable  Meds ordered this encounter  Medications  . amoxicillin-clavulanate (AUGMENTIN) 875-125 MG tablet    Sig: Take 1 tablet by mouth 2 (two) times daily for 7 days.    Dispense:  14 tablet    Refill:  0  . fluconazole (DIFLUCAN) 150 MG tablet    Sig: Take 1 tablet (150 mg) PO x 1 dose. May repeat 150 mg dose in 3 days if still symptomatic.    Dispense:  2 tablet    Refill:  0    Recommended Follow up Care:  Patient encouraged to follow up with the following  provider within the specified time frame, or sooner as dictated by the severity of her symptoms. As always, she was instructed that for any urgent/emergent care needs, she should seek care either here or in the emergency department for more immediate evaluation.  Follow-up Information    Tanda Rockers, MD In 1 week.   Specialty: Internal Medicine Why: General reassessment of symptoms if not improving Contact information: [address] [facility] [place] [address] [phone]         NOTE: This note was prepared using Dragon dictation software along with smaller phrase technology. Despite my best ability to proofread, there is the potential that transcriptional errors may still occur from this process, and are completely unintentional.     Karen Kitchens, NP 04/17/19 1725

## 2019-04-16 NOTE — ED Triage Notes (Signed)
Patient c/o right ear pain that started 3 days ago.  

## 2019-04-16 NOTE — Discharge Instructions (Signed)
It was very nice seeing you today in clinic. Thank you for entrusting me with your care.   Please utilize the medications that we discussed. Your prescriptions have been called in to your pharmacy (antibiotic for ear and medication for yeast infection).    Make arrangements to follow up with your regular doctor in 1 week for re-evaluation if not improving. If your symptoms/condition worsens, please seek follow up care either here or in the ER. Please remember, our [place] providers are "right here with you" when you need [place].   Again, it was my pleasure to take care of you today. Thank you for choosing our clinic. I hope that you start to feel better quickly.   Honor Loh, MSN, APRN, FNP-C, CEN Advanced Practice Provider [facility]

## 2019-04-22 DIAGNOSIS — R454 Irritability and anger: Secondary | ICD-10-CM | POA: Insufficient documentation

## 2019-04-22 DIAGNOSIS — M8580 Other specified disorders of bone density and structure, unspecified site: Secondary | ICD-10-CM | POA: Insufficient documentation

## 2019-04-23 DIAGNOSIS — E559 Vitamin D deficiency, unspecified: Secondary | ICD-10-CM | POA: Insufficient documentation

## 2019-04-23 DIAGNOSIS — E7800 Pure hypercholesterolemia, unspecified: Secondary | ICD-10-CM | POA: Insufficient documentation

## 2019-04-23 DIAGNOSIS — R7989 Other specified abnormal findings of blood chemistry: Secondary | ICD-10-CM | POA: Insufficient documentation

## 2019-05-05 ENCOUNTER — Other Ambulatory Visit: Payer: Self-pay

## 2019-05-05 DIAGNOSIS — Z20822 Contact with and (suspected) exposure to covid-19: Secondary | ICD-10-CM

## 2019-05-06 LAB — NOVEL CORONAVIRUS, NAA: SARS-CoV-2, NAA: NOT DETECTED

## 2019-05-07 ENCOUNTER — Encounter: Payer: Self-pay | Admitting: Emergency Medicine

## 2019-05-07 ENCOUNTER — Ambulatory Visit
Admission: EM | Admit: 2019-05-07 | Discharge: 2019-05-07 | Disposition: A | Discharge status: Home / Self Care | Payer: 59 | Attending: Family Medicine | Admitting: Family Medicine

## 2019-05-07 ENCOUNTER — Other Ambulatory Visit: Payer: Self-pay

## 2019-05-07 DIAGNOSIS — J029 Acute pharyngitis, unspecified: Secondary | ICD-10-CM | POA: Diagnosis not present

## 2019-05-07 LAB — RAPID STREP SCREEN (MED CTR MEBANE ONLY): Streptococcus, Group A Screen (Direct): NEGATIVE

## 2019-05-07 NOTE — ED Triage Notes (Signed)
Patient in today c/o sore throat, headache, body aches and chills x 3 days. Patient had Covid  test at drive thru on Monday, Oct 19 and it was negative. Patient states she did take some Mucinex and it gave her a little relief. Patient denies fever.

## 2019-05-07 NOTE — Discharge Instructions (Addendum)
Gargles/Lozenges. Rest. Drink plenty of fluids.   Follow up with your primary care physician this week as needed. Return to Urgent care for new or worsening concerns.

## 2019-05-07 NOTE — ED Provider Notes (Signed)
[facility] ____________________________________________  Time seen: Approximately 9:09 AM  I have reviewed the triage vital signs and the nursing notes.   HISTORY  Chief Complaint Sore Throat (APPT), Headache, Generalized Body Aches, and Chills   HPI Caitlyn Walton is a 53 y.o. female presenting for evaluation of sore throat present for the last 3 days.  States also accompanying headaches, chills and feeling tired.  States yesterday she began having some nasal congestion and occasional cough.  Denies chest pain, shortness of breath, fevers, known sick contacts or changes in taste and smell.  Did take Mucinex yesterday which did help alleviate symptoms.  Denies other aggravating alleviating factors.  Continues to remain active.  Denies recent sickness.  Reports otherwise doing well.  Marlene Bast, MD: PCP   Past Medical History:  Diagnosis Date  . Diabetes mellitus without complication (HCC)   . Hypertension     There are no active problems to display for this patient.   Past Surgical History:  Procedure Laterality Date  . CARPAL TUNNEL RELEASE    . CESAREAN SECTION    . ELBOW SURGERY    . FINGER SURGERY       No current facility-administered medications for this encounter.   Current Outpatient Medications:  .  Calcium Carbonate-Vitamin D (OYSTER SHELL CALCIUM 500 + D) 500-125 MG-UNIT TABS, Take by mouth., Disp: , Rfl:  .  Cholecalciferol (VITAMIN D3) 50 MCG (2000 UT) capsule, Take 3 capsules daily for 3 months, then reduce to 1 capsule daily thereafter for Vitamin D Deficiency., Disp: , Rfl:  .  estradiol (ESTRACE) 0.1 MG/GM vaginal cream, Place vaginally., Disp: , Rfl:  .  estradiol (ESTRACE) 0.5 MG tablet, Take by mouth., Disp: , Rfl:  .  glipiZIDE (GLUCOTROL XL) 10 MG 24 hr tablet, Take 20 mg by mouth daily., Disp: , Rfl: 3 .  hydrochlorothiazide (HYDRODIURIL) 25 MG tablet, Take 25 mg by mouth daily., Disp: , Rfl: 3 .  losartan (COZAAR) 100 MG  tablet, Take 100 mg by mouth daily., Disp: , Rfl: 3 .  lovastatin (MEVACOR) 10 MG tablet, Take by mouth., Disp: , Rfl:  .  medroxyPROGESTERone (PROVERA) 2.5 MG tablet, TAKE 1 TABLET BY MOUTH EVERY DAY, Disp: , Rfl:  .  polyethylene glycol (MIRALAX) packet, Take 17 g by mouth daily as needed for mild constipation or moderate constipation., Disp: 14 each, Rfl: 0 .  sertraline (ZOLOFT) 50 MG tablet, Take by mouth., Disp: , Rfl:  .  vitamin B-12 (CYANOCOBALAMIN) 1000 MCG tablet, Take 1 tablet daily for Vitamin B12 Deficiency., Disp: , Rfl:   Allergies Insulin glargine, Peanut oil, Metformin, Aleve [naproxen sodium], Latex, and Sulfamethoxazole-trimethoprim  Family History  Problem Relation Age of Onset  . Stroke Mother   . Diabetes Mother   . Hypertension Mother   . Emphysema Father   . Diabetes Father   . Hypertension Father   . Breast cancer Neg Hx     Social History Social History   Tobacco Use  . Smoking status: Never Smoker  . Smokeless tobacco: Never Used  Substance Use Topics  . Alcohol use: Yes    Comment: rarely  . Drug use: No    Review of Systems Constitutional: No fever.  ENT: Positive sore throat. Cardiovascular: Denies chest pain. Respiratory: Denies shortness of breath. Gastrointestinal: No abdominal pain.  No nausea, no vomiting.  Musculoskeletal: Negative for back pain. Skin: Negative for rash.   ____________________________________________   PHYSICAL EXAM:  VITAL SIGNS: ED Triage Vitals  Enc Vitals Group     BP 05/07/19 0835 122/90     Pulse Rate 05/07/19 0835 84     Resp 05/07/19 0835 18     Temp 05/07/19 0835 98.7 F (37.1 C)     Temp Source 05/07/19 0835 Oral     SpO2 05/07/19 0835 99 %     Weight 05/07/19 0836 175 lb (79.4 kg)     Height 05/07/19 0836 5' 3.75" (1.619 m)     Head Circumference --      Peak Flow --      Pain Score 05/07/19 0835 7     Pain Loc --      Pain Edu? --      Excl. in [place]? --     Constitutional: Alert and  oriented. Well appearing and in no acute distress. Eyes: Conjunctivae are normal.  Head: Atraumatic. No sinus tenderness to palpation. No swelling. No erythema.  Ears: no erythema, normal TMs bilaterally.   Nose:No nasal congestion   Mouth/Throat: Mucous membranes are moist. Mild pharyngeal erythema. No tonsillar swelling or exudate.  Neck: No stridor.  No cervical spine tenderness to palpation. Hematological/Lymphatic/Immunilogical: No cervical lymphadenopathy. Cardiovascular: Normal rate, regular rhythm. Grossly normal heart sounds.  Good peripheral circulation. Respiratory: Normal respiratory effort.  No retractions. No wheezes, rales or rhonchi. Good air movement.  Gastrointestinal: Soft and nontender. Musculoskeletal: Ambulatory with steady gait. Neurologic:  Normal speech and language. No gait instability. Skin:  Skin appears warm, dry and intact. No rash noted. Psychiatric: Mood and affect are normal. Speech and behavior are normal. ___________________________________________   LABS (all labs ordered are listed, but only abnormal results are displayed)  Labs Reviewed  RAPID STREP SCREEN ([facility] ONLY)  CULTURE, GROUP A STREP [facility])    PROCEDURES Procedures    INITIAL IMPRESSION / ASSESSMENT AND PLAN / ED COURSE  Pertinent labs & imaging results that were available during my care of the patient were reviewed by me and considered in my medical decision making (see chart for details).  Well-appearing patient.  No acute distress.  Strep today negative, will culture. Covid 19 test 05/05/19 negative.  Suspect viral illness.  Continue over-the-counter Mucinex, Tylenol, ibuprofen, rest, fluids and supportive care.  Work note given.  Discussed follow up with Primary care physician this week as needed. Discussed follow up and return parameters including no resolution or any worsening concerns. Patient verbalized understanding and agreed to plan.    ____________________________________________   FINAL CLINICAL IMPRESSION(S) / ED DIAGNOSES  Final diagnoses:  Pharyngitis, unspecified etiology     ED Discharge Orders    None       Note: This dictation was prepared with Dragon dictation along with smaller phrase technology. Any transcriptional errors that result from this process are unintentional.         Marylene Land, NP 05/07/19 [phone]

## 2019-05-09 LAB — CULTURE, GROUP A STREP (THRC)

## 2019-09-17 DIAGNOSIS — N3941 Urge incontinence: Secondary | ICD-10-CM | POA: Insufficient documentation

## 2019-10-20 ENCOUNTER — Ambulatory Visit: Payer: 59 | Attending: Internal Medicine

## 2019-10-20 DIAGNOSIS — Z23 Encounter for immunization: Secondary | ICD-10-CM

## 2019-10-20 NOTE — Progress Notes (Signed)
   Covid-19 Vaccination Clinic  Name:  Caitlyn Walton    MRN: [id number] DOB: 1966/04/30  10/20/2019  Caitlyn Walton was observed post Covid-19 immunization for 15 minutes without incident. She was provided with Vaccine Information Sheet and instruction to access the V-Safe system.   Caitlyn Walton was instructed to call 911 with any severe reactions post vaccine: Marland Kitchen Difficulty breathing  . Swelling of face and throat  . A fast heartbeat  . A bad rash all over body  . Dizziness and weakness   Immunizations Administered    Name Date Dose VIS Date Route   Pfizer COVID-19 Vaccine 10/20/2019  3:30 PM 0.3 mL 06/27/2019 Intramuscular   Manufacturer: [employer], [employer]   Lot: [id number]   NDC: [address]-0

## 2019-11-10 ENCOUNTER — Ambulatory Visit: Payer: 59

## 2019-11-18 ENCOUNTER — Ambulatory Visit: Payer: 59 | Attending: Internal Medicine

## 2019-11-18 ENCOUNTER — Ambulatory Visit: Payer: 59

## 2019-11-18 DIAGNOSIS — Z23 Encounter for immunization: Secondary | ICD-10-CM

## 2019-11-18 NOTE — Progress Notes (Signed)
   Covid-19 Vaccination Clinic  Name:  Caitlyn Walton    MRN: [id number] DOB: 04/01/1966  11/18/2019  [name] was observed post Covid-19 immunization for 15 minutes without incident. She was provided with Vaccine Information Sheet and instruction to access the V-Safe system.   [name] was instructed to call 911 with any severe reactions post vaccine: Marland Kitchen Difficulty breathing  . Swelling of face and throat  . A fast heartbeat  . A bad rash all over body  . Dizziness and weakness   Immunizations Administered    Name Date Dose VIS Date Route   Pfizer COVID-19 Vaccine 11/18/2019 11:11 AM 0.3 mL 09/10/2018 Intramuscular   Manufacturer: [employer], [employer]   Lot: [id number]   NDC: [address]-4

## 2019-11-21 ENCOUNTER — Ambulatory Visit: Payer: 59

## 2020-07-29 ENCOUNTER — Other Ambulatory Visit: Payer: Self-pay

## 2020-07-29 ENCOUNTER — Ambulatory Visit
Admission: EM | Admit: 2020-07-29 | Discharge: 2020-07-29 | Disposition: A | Discharge status: Home / Self Care | Payer: 59

## 2020-07-29 DIAGNOSIS — H6982 Other specified disorders of Eustachian tube, left ear: Secondary | ICD-10-CM | POA: Diagnosis not present

## 2020-07-29 DIAGNOSIS — H6992 Unspecified Eustachian tube disorder, left ear: Secondary | ICD-10-CM

## 2020-07-29 NOTE — ED Provider Notes (Signed)
Renaldo Fiddler    CSN: [id number] Arrival date & time: 07/29/20  0850      History   Chief Complaint Chief Complaint  Patient presents with  . Otalgia    HPI Caitlyn Walton is a 55 y.o. female.   Patient is a 55 year old female who presents today with left ear pain.  This started proxy 2 days ago.Marland Kitchen  Describes as pressure.  Mild nasal congestion.  History of ear infection appears back.  No fevers, chills.     Past Medical History:  Diagnosis Date  . Diabetes mellitus without complication (HCC)   . Hypertension     There are no problems to display for this patient.   Past Surgical History:  Procedure Laterality Date  . CARPAL TUNNEL RELEASE    . CESAREAN SECTION    . ELBOW SURGERY    . FINGER SURGERY      OB History    Gravida  3   Para      Term      Preterm      AB      Living        SAB      IAB      Ectopic      Multiple      Live Births  3            Home Medications    Prior to Admission medications   Medication Sig Start Date End Date Taking? Authorizing Provider  Calcium Carbonate-Vitamin D (OYSTER SHELL CALCIUM 500 + D) 500-125 MG-UNIT TABS Take by mouth. 04/23/19 04/22/20  [provider]  Cholecalciferol (VITAMIN D3) 50 MCG (2000 UT) capsule Take 3 capsules daily for 3 months, then reduce to 1 capsule daily thereafter for Vitamin D Deficiency. 04/23/19   [provider]  estradiol (ESTRACE) 0.1 MG/GM vaginal cream Place vaginally. 03/20/19   [provider]  estradiol (ESTRACE) 0.5 MG tablet Take by mouth. 09/25/18   [provider]  glipiZIDE (GLUCOTROL XL) 10 MG 24 hr tablet Take 20 mg by mouth daily. 02/12/17   [provider]  hydrochlorothiazide (HYDRODIURIL) 25 MG tablet Take 25 mg by mouth daily. 01/30/17   [provider]  losartan (COZAAR) 100 MG tablet Take 100 mg by mouth daily. 01/30/17   [provider]  lovastatin (MEVACOR) 10 MG tablet Take  by mouth. 04/23/19 04/22/20  [provider]  medroxyPROGESTERone (PROVERA) 2.5 MG tablet TAKE 1 TABLET BY MOUTH EVERY DAY 10/29/17   [provider]  polyethylene glycol (MIRALAX) packet Take 17 g by mouth daily as needed for mild constipation or moderate constipation. 09/14/17   Myrna Blazer, MD  sertraline (ZOLOFT) 50 MG tablet Take by mouth. 04/22/19 04/21/20  [provider]  vitamin B-12 (CYANOCOBALAMIN) 1000 MCG tablet Take 1 tablet daily for Vitamin B12 Deficiency. 04/23/19   [provider]  Insulin Glargine (BASAGLAR KWIKPEN [place]) Inject 40 Units into the skin at bedtime.  05/07/19  [provider]    Family History Family History  Problem Relation Age of Onset  . Stroke Mother   . Diabetes Mother   . Hypertension Mother   . Emphysema Father   . Diabetes Father   . Hypertension Father   . Breast cancer Neg Hx     Social History Social History   Tobacco Use  . Smoking status: Never Smoker  . Smokeless tobacco: Never Used  Vaping Use  . Vaping Use: Never used  Substance  Use Topics  . Alcohol use: Yes    Comment: rarely  . Drug use: No     Allergies   Insulin glargine, Peanut oil, Metformin, Aleve [naproxen sodium], Latex, and Sulfamethoxazole-trimethoprim   Review of Systems Review of Systems   Physical Exam Triage Vital Signs ED Triage Vitals  Enc Vitals Group     BP 07/29/20 0909 132/84     Pulse Rate 07/29/20 0909 96     Resp 07/29/20 0909 16     Temp 07/29/20 0909 98.7 F (37.1 C)     Temp Source 07/29/20 0909 Oral     SpO2 07/29/20 0909 98 %     Weight --      Height --      Head Circumference --      Peak Flow --      Pain Score 07/29/20 0913 6     Pain Loc --      Pain Edu? --      Excl. in [place]? --    No data found.  Updated Vital Signs BP 132/84 (BP Location: Right Arm)   Pulse 96   Temp 98.7 F (37.1 C) (Oral)   Resp 16   SpO2 98%   Visual Acuity Right Eye Distance:   Left Eye  Distance:   Bilateral Distance:    Right Eye Near:   Left Eye Near:    Bilateral Near:     Physical Exam Vitals and nursing note reviewed.  Constitutional:      General: She is not in acute distress.    Appearance: Normal appearance. She is not ill-appearing, toxic-appearing or diaphoretic.  HENT:     Head: Normocephalic.     Right Ear: Ear canal and external ear normal.     Left Ear: Ear canal and external ear normal.     Ears:     Comments: Bilateral TM injection.  Eyes:     Conjunctiva/sclera: Conjunctivae normal.  Pulmonary:     Effort: Pulmonary effort is normal.  Musculoskeletal:        General: Normal range of motion.     Cervical back: Normal range of motion.  Skin:    General: Skin is warm and dry.     Findings: No rash.  Neurological:     Mental Status: She is alert.  Psychiatric:        Mood and Affect: Mood normal.      UC Treatments / Results  Labs (all labs ordered are listed, but only abnormal results are displayed) Labs Reviewed - No data to display  EKG   Radiology No results found.  Procedures Procedures (including critical care time)  Medications Ordered in UC Medications - No data to display  Initial Impression / Assessment and Plan / UC Course  I have reviewed the triage vital signs and the nursing notes.  Pertinent labs & imaging results that were available during my care of the patient were reviewed by me and considered in my medical decision making (see chart for details).     Eustachian tube dysfunction.  No concerns for otitis media.  Treating with Flonase and low-dose Sudafed Recommended monitor blood pressures Follow up as needed for continued or worsening symptoms  Final Clinical Impressions(s) / UC Diagnoses   Final diagnoses:  Eustachian tube dysfunction, left     Discharge Instructions     You can do flonase nasal spray. 2 sprays in each nostril daily.  Also try sudafed OTC. Low dose to avoid  increase in blood  pressure Follow up as needed for continued or worsening symptoms     ED Prescriptions    None     PDMP not reviewed this encounter.   Janace Aris, NP 07/29/20 [phone]

## 2020-07-29 NOTE — Discharge Instructions (Addendum)
You can do flonase nasal spray. 2 sprays in each nostril daily.  Also try sudafed OTC. Low dose to avoid increase in blood pressure Follow up as needed for continued or worsening symptoms

## 2020-07-29 NOTE — ED Triage Notes (Signed)
Pt c/o of left ear pain that started x 2 days ago. Has a hx of ear infections.   Denies fever.

## 2020-07-30 ENCOUNTER — Ambulatory Visit: Payer: Self-pay

## 2020-11-03 ENCOUNTER — Other Ambulatory Visit: Payer: Self-pay | Admitting: Gerontology

## 2020-11-03 DIAGNOSIS — Z1231 Encounter for screening mammogram for malignant neoplasm of breast: Secondary | ICD-10-CM

## 2021-06-20 DIAGNOSIS — M25569 Pain in unspecified knee: Secondary | ICD-10-CM | POA: Insufficient documentation

## 2021-12-19 ENCOUNTER — Other Ambulatory Visit: Payer: Self-pay | Admitting: Gerontology

## 2021-12-19 DIAGNOSIS — Z1231 Encounter for screening mammogram for malignant neoplasm of breast: Secondary | ICD-10-CM

## 2022-01-12 ENCOUNTER — Ambulatory Visit
Admission: RE | Admit: 2022-01-12 | Discharge: 2022-01-12 | Disposition: A | Discharge status: Home / Self Care | Payer: 59 | Source: Ambulatory Visit | Attending: Gerontology | Admitting: Gerontology

## 2022-01-12 DIAGNOSIS — Z1231 Encounter for screening mammogram for malignant neoplasm of breast: Secondary | ICD-10-CM | POA: Diagnosis present

## 2022-03-10 ENCOUNTER — Ambulatory Visit
Admission: RE | Admit: 2022-03-10 | Discharge: 2022-03-10 | Disposition: A | Discharge status: Home / Self Care | Payer: 59 | Source: Ambulatory Visit | Attending: Family Medicine | Admitting: Family Medicine

## 2022-03-10 ENCOUNTER — Ambulatory Visit (INDEPENDENT_AMBULATORY_CARE_PROVIDER_SITE_OTHER): Payer: 59

## 2022-03-10 VITALS — BP 128/82 | HR 86 | Temp 99.0°F | Resp 14 | Ht 63.0 in | Wt 188.0 lb

## 2022-03-10 DIAGNOSIS — R059 Cough, unspecified: Secondary | ICD-10-CM | POA: Diagnosis not present

## 2022-03-10 DIAGNOSIS — J00 Acute nasopharyngitis [common cold]: Secondary | ICD-10-CM

## 2022-03-10 LAB — SARS CORONAVIRUS 2 BY RT PCR: SARS Coronavirus 2 by RT PCR: NEGATIVE

## 2022-03-10 NOTE — Discharge Instructions (Signed)
Your COVID test was negative.  Your chest x-ray did not show any signs of pneumonia or fluid in your lungs.   You can take Tylenol and/or Ibuprofen as needed for fever reduction and pain relief.    For cough: honey 1/2 to 1 teaspoon (you can dilute the honey in water or another fluid).  You can also use guaifenesin and dextromethorphan for cough. You can use a humidifier for chest congestion and cough.  If you don't have a humidifier, you can sit in the bathroom with the hot shower running.      For sore throat: try warm salt water gargles, cepacol lozenges, throat spray, warm tea or water with lemon/honey, popsicles or ice, or OTC cold relief medicine for throat discomfort.    For congestion: take a daily anti-histamine like Zyrtec, Claritin, and a oral decongestant, such as pseudoephedrine.  You can also use Flonase 1-2 sprays in each nostril daily.    It is important to stay hydrated: drink plenty of fluids (water, gatorade/powerade/pedialyte, juices, or teas) to keep your throat moisturized and help further relieve irritation/discomfort.    Return or go to the Emergency Department if symptoms worsen or do not improve in the next few days

## 2022-03-10 NOTE — ED Provider Notes (Signed)
[facility]    CSN: [id number] Arrival date & time: 03/10/22  0845      History   Chief Complaint Chief Complaint  Patient presents with   Cough   Otalgia   Nasal Congestion    HPI Caitlyn Walton is a 56 y.o. female.   HPI   Caitlyn Walton presents for cough and chest tightness.  Reports today that she had a cough started sneezing and had body aches.  Yesterday, dry cough worsened and then her right ear started hurting.  She also has a sore throat.  He took Tylenol and Advil last night about 6 PM.  Reports having had fever and her temperature was above 100 F.  She did not look at the last couple of numbers.  Versus sore throat, fever, ear pain, cough with sputum, myalgias, rhinorrhea, congestion, chest tightness, decreased appetite, nausea, sleep disturbance.  Denies shortness of breath changes in hydration, vomiting, diarrhea, headache.  Took COVID test yesterday at home and it was negative.  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     Past Medical History:  Diagnosis Date   Diabetes mellitus without complication ([place])    Hypertension     There are no problems to display for this patient.   Past Surgical History:  Procedure Laterality Date   CARPAL TUNNEL RELEASE     CESAREAN SECTION     ELBOW SURGERY     FINGER SURGERY      OB History     Gravida  3   Para      Term      Preterm      AB      Living         SAB      IAB      Ectopic      Multiple      Live Births  3            Home Medications    Prior to Admission medications   Medication Sig Start Date End Date Taking? Authorizing Provider  estradiol (ESTRACE) 0.1 MG/GM vaginal cream Place vaginally. 03/20/19  Yes [provider]  gabapentin (NEURONTIN) 300 MG capsule TAKE 1 CAPSULE BY MOUTH EVERY NIGHT 12/16/21  Yes [provider]  glipiZIDE (GLUCOTROL XL) 10 MG 24 hr tablet Take 20 mg by mouth daily. 02/12/17  Yes [provider]   losartan (COZAAR) 100 MG tablet Take 100 mg by mouth daily. 01/30/17  Yes [provider]  medroxyPROGESTERone (PROVERA) 2.5 MG tablet TAKE 1 TABLET BY MOUTH EVERY DAY 10/29/17  Yes [provider]  Semaglutide, 2 MG/DOSE, 8 MG/3ML SOPN Inject into the skin. 12/19/21  Yes [provider]  triamterene-hydrochlorothiazide (MAXZIDE-25) 37.5-25 MG tablet Take 1 tablet by mouth daily. 03/06/22  Yes [provider]  vitamin B-12 (CYANOCOBALAMIN) 1000 MCG tablet Take 1 tablet daily for Vitamin B12 Deficiency. 04/23/19  Yes [provider]  Calcium Carbonate-Vitamin D (OYSTER SHELL CALCIUM 500 + D) 500-125 MG-UNIT TABS Take by mouth. 04/23/19 04/22/20  [provider]  Cholecalciferol (VITAMIN D3) 50 MCG (2000 UT) capsule Take 3 capsules daily for 3 months, then reduce to 1 capsule daily thereafter for Vitamin D Deficiency. 04/23/19   [provider]  estradiol (ESTRACE) 0.5 MG tablet Take by mouth. 09/25/18   [provider]  hydrochlorothiazide (HYDRODIURIL) 25 MG tablet Take 25 mg by mouth daily. 01/30/17   [provider]  lovastatin (MEVACOR) 10 MG tablet Take by  mouth. 04/23/19 04/22/20  [provider]  polyethylene glycol (MIRALAX) packet Take 17 g by mouth daily as needed for mild constipation or moderate constipation. 09/14/17   Myrna Blazer, MD  sertraline (ZOLOFT) 50 MG tablet Take by mouth. 04/22/19 04/21/20  [provider]  Insulin Glargine (BASAGLAR KWIKPEN [place]) Inject 40 Units into the skin at bedtime.  05/07/19  [provider]    Family History Family History  Problem Relation Age of Onset   Stroke Mother    Diabetes Mother    Hypertension Mother    Emphysema Father    Diabetes Father    Hypertension Father    Breast cancer Neg Hx     Social History Social History   Tobacco Use   Smoking status: Never   Smokeless tobacco: Never  Vaping Use   Vaping Use: Never used   Substance Use Topics   Alcohol use: Yes    Comment: rarely   Drug use: No     Allergies   Insulin glargine, Peanut oil, Metformin, Aleve [naproxen sodium], Latex, and Sulfamethoxazole-trimethoprim   Review of Systems Review of Systems: :negative unless otherwise stated in HPI.      Physical Exam Triage Vital Signs ED Triage Vitals  Enc Vitals Group     BP 03/10/22 0918 128/82     Pulse Rate 03/10/22 0918 86     Resp 03/10/22 0918 14     Temp 03/10/22 0918 99 F (37.2 C)     Temp Source 03/10/22 0918 Oral     SpO2 03/10/22 0918 100 %     Weight 03/10/22 0914 188 lb (85.3 kg)     Height 03/10/22 0914 5\' 3"  (1.6 m)     Head Circumference --      Peak Flow --      Pain Score 03/10/22 0914 4     Pain Loc --      Pain Edu? --      Excl. in GC? --    No data found.  Updated Vital Signs BP 128/82 (BP Location: Left Arm)   Pulse 86   Temp 99 F (37.2 C) (Oral)   Resp 14   Ht 5\' 3"  (1.6 m)   Wt 85.3 kg   SpO2 100%   BMI 33.30 kg/m   Visual Acuity Right Eye Distance:   Left Eye Distance:   Bilateral Distance:    Right Eye Near:   Left Eye Near:    Bilateral Near:     Physical Exam GEN:     alert, non-toxic appearing female in no distress    HENT:  mucus membranes moist, oropharyngeal without lesions or exudate, no tonsillar hypertrophy, mild erythema ,  moderate turbinate hypertrophy, clear nasal discharge, bilateral TM normal EYES:   pupils equal and reactive, no scleral injection NECK:  normal ROM, no lymphadenopathy, no meningismus   RESP:  no increased work of breathing, clear to auscultation bilaterally,   CVS:   regular rate and rhythm Skin:   warm and dry, no rash on visible skin, normal skin turgor    UC Treatments / Results  Labs (all labs ordered are listed, but only abnormal results are displayed) Labs Reviewed  SARS CORONAVIRUS 2 BY RT PCR    EKG   Radiology DG Chest 2 View  Result Date: 03/10/2022 CLINICAL DATA:  Cough. EXAM:  CHEST - 2 VIEW COMPARISON:  March 23, 2017. FINDINGS: The heart size and mediastinal contours are within normal limits. Both lungs  are clear. The visualized skeletal structures are unremarkable. IMPRESSION: No active cardiopulmonary disease. Electronically Signed   By: Lupita Raider M.D.   On: 03/10/2022 10:46    Procedures Procedures (including critical care time)  Medications Ordered in UC Medications - No data to display  Initial Impression / Assessment and Plan / UC Course  I have reviewed the triage vital signs and the nursing notes.  Pertinent labs & imaging results that were available during my care of the patient were reviewed by me and considered in my medical decision making (see chart for details).      Patient is a 56 year old female who presents for cough, chest tightness and nasal congestion.  Overall pt is well appearing, well hydrated, without respiratory distress.COVID testing obtained and was negative.  Chest x-ray reviewed by me was unremarkable for focal pneumonia, pneumothorax or pleural effusions.  History consistent with viral illness.  Discussed symptomatic treatment. Explained lack of efficacy of antibiotics in viral disease. - continue Tylenol/ Motrin as needed for discomfort/discomfort - nasal saline to help with his nasal congestion - Use a mist humidifier to help with breathing - Stressed importance of hydration - Work/school note provided, per patient request - Discussed return and ED precautions, understanding voiced     Final Clinical Impressions(s) / UC Diagnoses   Final diagnoses:  Acute nasopharyngitis     Discharge Instructions      Your COVID test was negative.  Your chest x-ray did not show any signs of pneumonia or fluid in your lungs.   You can take Tylenol and/or Ibuprofen as needed for fever reduction and pain relief.    For cough: honey 1/2 to 1 teaspoon (you can dilute the honey in water or another fluid).  You can also use  guaifenesin and dextromethorphan for cough. You can use a humidifier for chest congestion and cough.  If you don't have a humidifier, you can sit in the bathroom with the hot shower running.      For sore throat: try warm salt water gargles, cepacol lozenges, throat spray, warm tea or water with lemon/honey, popsicles or ice, or OTC cold relief medicine for throat discomfort.    For congestion: take a daily anti-histamine like Zyrtec, Claritin, and a oral decongestant, such as pseudoephedrine.  You can also use Flonase 1-2 sprays in each nostril daily.    It is important to stay hydrated: drink plenty of fluids (water, gatorade/powerade/pedialyte, juices, or teas) to keep your throat moisturized and help further relieve irritation/discomfort.    Return or go to the Emergency Department if symptoms worsen or do not improve in the next few days      ED Prescriptions   None    PDMP not reviewed this encounter.   Katha Cabal, DO 03/10/22 1753

## 2022-03-10 NOTE — ED Triage Notes (Signed)
Patient c/o runny nose, cough, and sneezing that started on Wed.  Patient reports bodyaches and headaches on Thursday.  Patient also reports right ear pain.  Patient reports low grade fevers.

## 2022-05-19 ENCOUNTER — Ambulatory Visit
Admission: EM | Admit: 2022-05-19 | Discharge: 2022-05-19 | Disposition: A | Discharge status: Home / Self Care | Payer: 59 | Attending: Emergency Medicine | Admitting: Emergency Medicine

## 2022-05-19 DIAGNOSIS — H0011 Chalazion right upper eyelid: Secondary | ICD-10-CM | POA: Diagnosis not present

## 2022-05-19 MED ORDER — DOXYCYCLINE HYCLATE 100 MG PO CAPS
100.0000 mg | ORAL_CAPSULE | Freq: Two times a day (BID) | ORAL | 0 refills | Status: AC
Start: 2022-05-19 — End: 2022-05-24

## 2022-05-19 NOTE — ED Triage Notes (Signed)
Pt c/o RT pain below eyelid onset Sunday, pt states she noticed a little bump, irritated from rubbing it, gunky stuff from eye

## 2022-05-19 NOTE — Discharge Instructions (Signed)
Start doing warm compresses 4 times a day. You may get some baby shampoo, dilute this with warm water, and gently wipe your upper and lower eyelid while you are taking a shower. This will help prevent the recurrence of any styes or chalazions. Follow up with ophthalmologist if this does not get better within several days. Try not to rub your eyes. You may use Systane as often as you want for comfort. Return immediately to the ER for fever above 100.4, if you have pain moving your eyes, any visual changes, nausea, vomiting, headaches, a rash around your eye, or any other concerns.  Go to www.goodrx.com  or www.costplusdrugs.com to look up your medications. This will give you a list of where you can find your prescriptions at the most affordable prices. Or ask the pharmacist what the cash price is, or if they have any other discount programs available to help make your medication more affordable. This can be less expensive than what you would pay with insurance.

## 2022-05-19 NOTE — ED Provider Notes (Signed)
HPI  SUBJECTIVE:  Caitlyn Walton is a 56 y.o. female who presents with right upper eyelid pain, swelling for the past 5 days.  She reports a bump of gradually increasing size along the lateral eyelid that started draining yellow, thick, drainage last night, with improvement in the eyelid swelling and pain.  She believes that the drainage is resolving.  No fevers, headache, pain with extraocular movements, photophobia, visual changes, periorbital erythema, edema.  She wears glasses only.  Does not wear contacts.  She has tried warm compresses with improvement in her symptoms.  Symptoms are worse with rubbing her eye.  She has a past medical history of diabetes, hypertension.  No history of MRSA.  [facility]: None.    Past Medical History:  Diagnosis Date   Diabetes mellitus without complication ([facility])    Hypertension     Past Surgical History:  Procedure Laterality Date   CARPAL TUNNEL RELEASE     CESAREAN SECTION     ELBOW SURGERY     FINGER SURGERY      Family History  Problem Relation Age of Onset   Stroke Mother    Diabetes Mother    Hypertension Mother    Emphysema Father    Diabetes Father    Hypertension Father    Breast cancer Neg Hx     Social History   Tobacco Use   Smoking status: Never   Smokeless tobacco: Never  Vaping Use   Vaping Use: Never used  Substance Use Topics   Alcohol use: Yes    Comment: rarely   Drug use: No    No current facility-administered medications for this encounter.  Current Outpatient Medications:    doxycycline (VIBRAMYCIN) 100 MG capsule, Take 1 capsule (100 mg total) by mouth 2 (two) times daily for 5 days., Disp: 10 capsule, Rfl: 0   Calcium Carbonate-Vitamin D (OYSTER SHELL CALCIUM 500 + D) 500-125 MG-UNIT TABS, Take by mouth., Disp: , Rfl:    Cholecalciferol (VITAMIN D3) 50 MCG (2000 UT) capsule, Take 3 capsules daily for 3 months, then reduce to 1 capsule daily thereafter for Vitamin D  Deficiency., Disp: , Rfl:    estradiol (ESTRACE) 0.1 MG/GM vaginal cream, Place vaginally., Disp: , Rfl:    estradiol (ESTRACE) 0.5 MG tablet, Take by mouth., Disp: , Rfl:    gabapentin (NEURONTIN) 300 MG capsule, TAKE 1 CAPSULE BY MOUTH EVERY NIGHT, Disp: , Rfl:    glipiZIDE (GLUCOTROL XL) 10 MG 24 hr tablet, Take 20 mg by mouth daily., Disp: , Rfl: 3   hydrochlorothiazide (HYDRODIURIL) 25 MG tablet, Take 25 mg by mouth daily., Disp: , Rfl: 3   losartan (COZAAR) 100 MG tablet, Take 100 mg by mouth daily., Disp: , Rfl: 3   lovastatin (MEVACOR) 10 MG tablet, Take by mouth., Disp: , Rfl:    medroxyPROGESTERone (PROVERA) 2.5 MG tablet, TAKE 1 TABLET BY MOUTH EVERY DAY, Disp: , Rfl:    polyethylene glycol (MIRALAX) packet, Take 17 g by mouth daily as needed for mild constipation or moderate constipation., Disp: 14 each, Rfl: 0   Semaglutide, 2 MG/DOSE, 8 MG/3ML SOPN, Inject into the skin., Disp: , Rfl:    sertraline (ZOLOFT) 50 MG tablet, Take by mouth., Disp: , Rfl:    triamterene-hydrochlorothiazide (MAXZIDE-25) 37.5-25 MG tablet, Take 1 tablet by mouth daily., Disp: , Rfl:    vitamin B-12 (CYANOCOBALAMIN) 1000 MCG tablet, Take 1 tablet daily for Vitamin B12 Deficiency., Disp: , Rfl:   Allergies  Allergen  Reactions   Insulin Glargine Swelling   Peanut Oil Swelling   Metformin     Other reaction(s): Other (See Comments) Diarrhea, abdominal pain Diarrhea, abdominal pain    Aleve [Naproxen Sodium] Rash   Latex Itching and Rash   Sulfamethoxazole-Trimethoprim Other (See Comments)    Other reaction(s): Other (See Comments) Subjective fever/chills, increase in upper airway secretions Subjective fever/chills, increase in upper airway secretions      ROS  As noted in HPI.   Physical Exam  BP 119/77 (BP Location: Left Arm)   Pulse 84   Temp 98 F (36.7 C) (Oral)   Ht 5' 3.75" (1.619 m)   Wt 81.6 kg   SpO2 100%   BMI 31.14 kg/m   Constitutional: Well developed, well nourished,  no acute distress Eyes:  EOMI, PERRLA, conjunctiva normal bilaterally.  No direct or consensual photophobia. Mild tenderness lateral upper eyelid.  No erythema, eyelid swelling.  Positive pointing seen on the inside of the lateral upper lid consistent with a draining chalazion.  No expressible purulent drainage.  No periorbital erythema, edema, tenderness.   Visual Acuity  Right Eye Distance: 20/25 (Corrected) Left Eye Distance: 20/25 (Corrected) Bilateral Distance: 20/20 (Corrected)  Right Eye Near:   Left Eye Near:    Bilateral Near:     HENT: Normocephalic, atraumatic,mucus membranes moist Respiratory: Normal inspiratory effort Cardiovascular: Normal rate GI: nondistended skin: No rash, skin intact Musculoskeletal: no deformities Neurologic: Alert & oriented x 3, no focal neuro deficits Psychiatric: Speech and behavior appropriate   ED Course   Medications - No data to display  No orders of the defined types were placed in this encounter.   No results found for this or any previous visit (from the past 24 hour(s)). No results found.  ED Clinical Impression  1. Chalazion of right upper eyelid      ED Assessment/Plan      Patient with a chalazion that has drained on its own.  will have her continue warm compresses, start lid hygiene daily and sent home on doxycycline for 5 days to cover any possible cellulitis.  Choosing oral medications because I do not think eyedrops or an ointment will reach the chalazion.  Follow-up with ophthalmology as needed.  Dr. Wallace Going on-call  Discussed MDM, treatment plan, and plan for follow-up with patient.  patient agrees with plan.   Meds ordered this encounter  Medications   doxycycline (VIBRAMYCIN) 100 MG capsule    Sig: Take 1 capsule (100 mg total) by mouth 2 (two) times daily for 5 days.    Dispense:  10 capsule    Refill:  0      *This clinic note was created using [employer]. Therefore, there may be  occasional mistakes despite careful proofreading.  ?    Melynda Ripple, MD 05/20/22 [phone]

## 2023-01-02 ENCOUNTER — Other Ambulatory Visit: Payer: Self-pay | Admitting: Gerontology

## 2023-01-02 DIAGNOSIS — N958 Other specified menopausal and perimenopausal disorders: Secondary | ICD-10-CM | POA: Insufficient documentation

## 2023-01-02 DIAGNOSIS — K219 Gastro-esophageal reflux disease without esophagitis: Secondary | ICD-10-CM | POA: Insufficient documentation

## 2023-01-02 DIAGNOSIS — Z1231 Encounter for screening mammogram for malignant neoplasm of breast: Secondary | ICD-10-CM

## 2023-01-15 ENCOUNTER — Inpatient Hospital Stay: Admission: RE | Admit: 2023-01-15 | Payer: 59 | Source: Ambulatory Visit

## 2023-03-20 ENCOUNTER — Ambulatory Visit
Admission: RE | Admit: 2023-03-20 | Discharge: 2023-03-20 | Disposition: A | Discharge status: Home / Self Care | Payer: 59 | Source: Ambulatory Visit | Attending: Gerontology | Admitting: Gerontology

## 2023-03-20 DIAGNOSIS — Z1231 Encounter for screening mammogram for malignant neoplasm of breast: Secondary | ICD-10-CM | POA: Diagnosis present

## 2024-02-11 ENCOUNTER — Other Ambulatory Visit: Payer: Self-pay | Admitting: Gerontology

## 2024-02-11 DIAGNOSIS — Z1231 Encounter for screening mammogram for malignant neoplasm of breast: Secondary | ICD-10-CM

## 2024-02-14 ENCOUNTER — Ambulatory Visit (INDEPENDENT_AMBULATORY_CARE_PROVIDER_SITE_OTHER)

## 2024-02-14 ENCOUNTER — Ambulatory Visit
Admission: RE | Admit: 2024-02-14 | Discharge: 2024-02-14 | Disposition: A | Discharge status: Home / Self Care | Source: Ambulatory Visit | Attending: Emergency Medicine | Admitting: Emergency Medicine

## 2024-02-14 ENCOUNTER — Ambulatory Visit (HOSPITAL_COMMUNITY): Payer: Self-pay

## 2024-02-14 VITALS — BP 132/84 | HR 80 | Temp 97.9°F | Resp 16

## 2024-02-14 DIAGNOSIS — M25511 Pain in right shoulder: Secondary | ICD-10-CM

## 2024-02-14 DIAGNOSIS — S40011A Contusion of right shoulder, initial encounter: Secondary | ICD-10-CM | POA: Diagnosis not present

## 2024-02-14 MED ORDER — METHYLPREDNISOLONE 4 MG PO TBPK: ORAL_TABLET | ORAL | 0 refills | Status: DC

## 2024-02-14 NOTE — ED Triage Notes (Signed)
 Pt st's she fell approx 2 weeks ago   St's she has had continued pain in right shoulder since then  Strong radial pulse present

## 2024-02-14 NOTE — Discharge Instructions (Addendum)
 Your x-rays did not show any evidence of broken or dislocated bones.  Your soft tissues did not demonstrate any significant swelling, though it has been 2 weeks since your injury.  Given that you have ongoing pain I suspect that you have bruised the soft tissue.  Begin taking the Medrol  Dosepak to help with your pain.  Take your morning dose when you pick up the medication.  Make sure you take it with food.  Apply moist heat to your shoulder for 20 minutes at a time, 2-3 times a day, to help improve blood flow to your shoulder and aid in healing.  Follow the shoulder range of motion exercises given your discharge instructions once or twice daily.  If you do not notice any improvement in your shoulder pain in the next week I would recommend following up with orthopedics such as [facility] here in [place] or in [place].

## 2024-02-14 NOTE — ED Provider Notes (Addendum)
 [facility]    CSN: [id number] Arrival date & time: 02/14/24  0804      History   Chief Complaint Chief Complaint  Patient presents with   Shoulder Injury    Entered by patient    HPI Caitlyn Walton is a 58 y.o. female.   HPI  58 year old female with past medical history significant for hypertension and diabetes presents for evaluation of right anterior shoulder pain status post ground-level fall 2 weeks ago.  She is currently rating her pain as a 7/10.  She has been using Tylenol  at home with minimal improvement.  She reports that the pain had been improving until she went to reach over her head yesterday and then she felt a sharp increase in the pain and the pain has been constant ever since.  No numbness or tingling in her fingers.  No bruising.  Past Medical History:  Diagnosis Date   Diabetes mellitus without complication (HCC)    Hypertension     There are no active problems to display for this patient.   Past Surgical History:  Procedure Laterality Date   CARPAL TUNNEL RELEASE     CESAREAN SECTION     ELBOW SURGERY     FINGER SURGERY      OB History     Gravida  3   Para      Term      Preterm      AB      Living         SAB      IAB      Ectopic      Multiple      Live Births  3            Home Medications    Prior to Admission medications   Medication Sig Start Date End Date Taking? Authorizing Provider  methylPREDNISolone  (MEDROL  DOSEPAK) 4 MG TBPK tablet Take according to the package insert. 02/14/24  Yes Bernardino Ditch, NP  Calcium Carbonate-Vitamin D (OYSTER SHELL CALCIUM 500 + D) 500-125 MG-UNIT TABS Take by mouth. 04/23/19 04/22/20  [provider]  Cholecalciferol (VITAMIN D3) 50 MCG (2000 UT) capsule Take 3 capsules daily for 3 months, then reduce to 1 capsule daily thereafter for Vitamin D Deficiency. 04/23/19   [provider]  estradiol (ESTRACE) 0.1 MG/GM vaginal cream Place  vaginally. 03/20/19   [provider]  estradiol (ESTRACE) 0.5 MG tablet Take by mouth. 09/25/18   [provider]  gabapentin (NEURONTIN) 300 MG capsule TAKE 1 CAPSULE BY MOUTH EVERY NIGHT 12/16/21   [provider]  glipiZIDE (GLUCOTROL XL) 10 MG 24 hr tablet Take 20 mg by mouth daily. 02/12/17   [provider]  hydrochlorothiazide (HYDRODIURIL) 25 MG tablet Take 25 mg by mouth daily. 01/30/17   [provider]  losartan (COZAAR) 100 MG tablet Take 100 mg by mouth daily. 01/30/17   [provider]  lovastatin (MEVACOR) 10 MG tablet Take by mouth. 04/23/19 04/22/20  [provider]  medroxyPROGESTERone (PROVERA) 2.5 MG tablet TAKE 1 TABLET BY MOUTH EVERY DAY 10/29/17   [provider]  polyethylene glycol (MIRALAX ) packet Take 17 g by mouth daily as needed for mild constipation or moderate constipation. 09/14/17   Yvonna Alm Cough, MD  Semaglutide, 2 MG/DOSE, 8 MG/3ML SOPN Inject into the skin. 12/19/21   [provider]  sertraline (ZOLOFT) 50 MG tablet Take by mouth. 04/22/19 04/21/20  [provider]  triamterene-hydrochlorothiazide (MAXZIDE-25) 37.5-25  MG tablet Take 1 tablet by mouth daily. 03/06/22   [provider]  vitamin B-12 (CYANOCOBALAMIN) 1000 MCG tablet Take 1 tablet daily for Vitamin B12 Deficiency. 04/23/19   [provider]  Insulin Glargine (BASAGLAR KWIKPEN [place]) Inject 40 Units into the skin at bedtime.  05/07/19  [provider]    Family History Family History  Problem Relation Age of Onset   Stroke Mother    Diabetes Mother    Hypertension Mother    Emphysema Father    Diabetes Father    Hypertension Father    Breast cancer Neg Hx     Social History Social History   Tobacco Use   Smoking status: Never   Smokeless tobacco: Never  Vaping Use   Vaping status: Never Used  Substance Use Topics   Alcohol use: Not Currently    Comment: rarely   Drug use: No      Allergies   Insulin glargine, Peanut oil, Metformin, Aleve [naproxen sodium], Latex, and Sulfamethoxazole-trimethoprim   Review of Systems Review of Systems  Musculoskeletal:  Positive for arthralgias. Negative for joint swelling.  Skin:  Negative for color change.  Neurological:  Negative for weakness and numbness.     Physical Exam Triage Vital Signs ED Triage Vitals  Encounter Vitals Group     BP      Girls Systolic BP Percentile      Girls Diastolic BP Percentile      Boys Systolic BP Percentile      Boys Diastolic BP Percentile      Pulse      Resp      Temp      Temp src      SpO2      Weight      Height      Head Circumference      Peak Flow      Pain Score      Pain Loc      Pain Education      Exclude from Growth Chart    No data found.  Updated Vital Signs BP 132/84 (BP Location: Left Arm)   Pulse 80   Temp 97.9 F (36.6 C) (Oral)   Resp 16   SpO2 97%   Visual Acuity Right Eye Distance:   Left Eye Distance:   Bilateral Distance:    Right Eye Near:   Left Eye Near:    Bilateral Near:     Physical Exam Vitals and nursing note reviewed.  Constitutional:      Appearance: Normal appearance. She is not ill-appearing.  HENT:     Head: Normocephalic and atraumatic.  Musculoskeletal:        General: Tenderness and signs of injury present. No swelling.  Skin:    General: Skin is warm and dry.     Capillary Refill: Capillary refill takes less than 2 seconds.     Findings: No bruising or erythema.  Neurological:     General: No focal deficit present.     Mental Status: She is alert and oriented to person, place, and time.      UC Treatments / Results  Labs (all labs ordered are listed, but only abnormal results are displayed) Labs Reviewed - No data to display  EKG   Radiology No results found.  Procedures Procedures (including critical care time)  Medications Ordered in UC Medications - No data to display  Initial  Impression / Assessment and Plan / UC Course  I  have reviewed the triage vital signs and the nursing notes.  Pertinent labs & imaging results that were available during my care of the patient were reviewed by me and considered in my medical decision making (see chart for details).   Patient is a pleasant, nontoxic-appearing 58 year old female presenting for evaluation of right shoulder pain as outlined in HPI above.  In the exam room her shoulder is in normal anatomical alignment.  She is able to reach cross and scratch her about the shoulder as well as abduct her arm to 90 degrees.  She is able to extend her forearms 90 degrees but she is unable to internally rotate and scratch her back.  The anterior shoulder girdle is tender without any focal finding.  No crepitus with palpation of the clavicle or acromion process.  Negative empty soup can test.  I suspect that this is a contusion of the shoulder girdle but I will obtain a radiograph to rule out any bony abnormality.  Right shoulder x-rays independently reviewed and evaluated by me.  Impression: No evidence of fracture or dislocation.  Soft tissue is unremarkable.  Radiology overread is pending. Impression states mild degenerative changes without acute osseous abnormality.  I will discharge patient with diagnosis of right shoulder contusion and start her on a Medrol  Dosepak to help with pain and inflammation.  I will have her apply moist heat to her shoulder for 20 minutes at a time, 2-3 times a day, to help with pain and inflammation.  I will also give her home physical therapy exercises to perform.  If her symptoms do not improve I will have her follow-up with orthopedics.   Final Clinical Impressions(s) / UC Diagnoses   Final diagnoses:  Acute pain of right shoulder  Contusion of right shoulder, initial encounter     Discharge Instructions      Your x-rays did not show any evidence of broken or dislocated bones.  Your soft tissues did  not demonstrate any significant swelling, though it has been 2 weeks since your injury.  Given that you have ongoing pain I suspect that you have bruised the soft tissue.  Begin taking the Medrol  Dosepak to help with your pain.  Take your morning dose when you pick up the medication.  Make sure you take it with food.  Apply moist heat to your shoulder for 20 minutes at a time, 2-3 times a day, to help improve blood flow to your shoulder and aid in healing.  Follow the shoulder range of motion exercises given your discharge instructions once or twice daily.  If you do not notice any improvement in your shoulder pain in the next week I would recommend following up with orthopedics such as [facility] here in [place] or in [place].     ED Prescriptions     Medication Sig Dispense Auth. Provider   methylPREDNISolone  (MEDROL  DOSEPAK) 4 MG TBPK tablet Take according to the package insert. 1 each Bernardino Ditch, NP      PDMP not reviewed this encounter.   Bernardino Ditch, NP 02/14/24 [id number]    Bernardino Ditch, NP 02/14/24 [phone]

## 2024-04-16 ENCOUNTER — Other Ambulatory Visit: Payer: Self-pay | Admitting: Orthopedic Surgery

## 2024-04-16 DIAGNOSIS — S46001A Unspecified injury of muscle(s) and tendon(s) of the rotator cuff of right shoulder, initial encounter: Secondary | ICD-10-CM

## 2024-04-16 DIAGNOSIS — M25311 Other instability, right shoulder: Secondary | ICD-10-CM

## 2024-04-25 ENCOUNTER — Ambulatory Visit
Admission: RE | Admit: 2024-04-25 | Discharge: 2024-04-25 | Disposition: A | Discharge status: Home / Self Care | Source: Ambulatory Visit | Attending: Orthopedic Surgery | Admitting: Orthopedic Surgery

## 2024-04-25 DIAGNOSIS — S46001A Unspecified injury of muscle(s) and tendon(s) of the rotator cuff of right shoulder, initial encounter: Secondary | ICD-10-CM

## 2024-04-25 DIAGNOSIS — M25311 Other instability, right shoulder: Secondary | ICD-10-CM

## 2024-06-16 ENCOUNTER — Ambulatory Visit
Admission: EM | Admit: 2024-06-16 | Discharge: 2024-06-16 | Disposition: A | Discharge status: Home / Self Care | Attending: Family Medicine | Admitting: Family Medicine

## 2024-06-16 DIAGNOSIS — N950 Postmenopausal bleeding: Secondary | ICD-10-CM | POA: Insufficient documentation

## 2024-06-16 DIAGNOSIS — J40 Bronchitis, not specified as acute or chronic: Secondary | ICD-10-CM | POA: Diagnosis not present

## 2024-06-16 MED ORDER — AZITHROMYCIN 250 MG PO TABS: 250.0000 mg | ORAL_TABLET | Freq: Every day | ORAL | 0 refills | Status: DC

## 2024-06-16 MED ORDER — PROMETHAZINE-DM 6.25-15 MG/5ML PO SYRP: 5.0000 mL | ORAL_SOLUTION | Freq: Four times a day (QID) | ORAL | 0 refills | Status: AC | PRN

## 2024-06-16 NOTE — Discharge Instructions (Signed)
 You may start Promethazine DM as needed for your cough.  Please note this medication will make you drowsy.  Do not drink alcohol or drive on this medication.  Provisional prescription for azithromycin antibiotic has been provided.  Please do not take it less your symptoms do not improve or worsen by December 4.  Lots of rest and fluids.  Please follow-up with your PCP if your symptoms do not improve.  Please go to the ER for any worsening symptoms.  I hope you feel better soon!

## 2024-06-16 NOTE — ED Triage Notes (Signed)
 Pt c/o cough & chest congestion x1 wk. Has tried OTC meds w/o relief. Denies any wheezing or sob.

## 2024-06-16 NOTE — ED Provider Notes (Signed)
 [facility]    CSN: [id number] Arrival date & time: 06/16/24  0840      History   Chief Complaint Chief Complaint  Patient presents with   Cough    HPI Lakea Mittelman is a 58 y.o. female  presents for evaluation of URI symptoms for 7 days. Patient reports associated symptoms of cough, congestion. Denies N/V/D, fevers, ear pain, sore throat, body aches, shortness of breath. Patient does not have a hx of asthma. Patient is not an active smoker.   Reports possible sick contacts at work.  Pt has taken Robitussin OTC for symptoms. Pt has no other concerns at this time.    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   Hiatal hernia with gastroesophageal reflux 01/02/2023   Knee pain 06/20/2021   Urge incontinence 09/17/2019   Hypercholesterolemia 04/23/2019   Low vitamin B12 level 04/23/2019   Vitamin D deficiency 04/23/2019   Irritable mood 04/22/2019   Osteopenia 04/22/2019   Body mass index (BMI) 31.0-31.9, adult 07/08/2018   Type 2 diabetes mellitus with peripheral neuropathy (HCC) 07/08/2018   Diabetes (HCC) 03/11/2013   Hypertension 03/11/2013   Psoriasis 03/11/2013    Past Surgical History:  Procedure Laterality Date   CARPAL TUNNEL RELEASE     CESAREAN SECTION     ELBOW SURGERY     FINGER SURGERY      OB History     Gravida  3   Para      Term      Preterm      AB      Living         SAB      IAB      Ectopic      Multiple      Live Births  3            Home Medications    Prior to Admission medications   Medication Sig Start Date End Date Taking? Authorizing Provider  azithromycin (ZITHROMAX) 250 MG tablet Take 1 tablet (250 mg total) by mouth daily. Take first 2 tablets together, then 1  every day until finished. 06/19/24  Yes Jasiya Markie, Jodi R, NP  pioglitazone (ACTOS) 15 MG tablet Take 1 tablet (15 mg total) by mouth once daily 07/15/23 07/14/24 Yes [provider]  promethazine-dextromethorphan (PROMETHAZINE-DM) 6.25-15 MG/5ML syrup Take 5 mLs by mouth 4 (four) times daily as needed for cough. 06/16/24  Yes Guenevere Roorda, Jodi R, NP  Calcium Carbonate-Vitamin D (OYSTER SHELL CALCIUM 500 + D) 500-125 MG-UNIT TABS Take by mouth. 04/23/19 04/22/20  [provider]  Cholecalciferol (VITAMIN D3) 50 MCG (2000 UT) capsule Take 3 capsules daily for 3 months, then reduce to 1 capsule daily thereafter for Vitamin D Deficiency. 04/23/19   [provider]  estradiol (ESTRACE) 0.1 MG/GM vaginal cream Place vaginally. 03/20/19   [provider]  estradiol (ESTRACE) 0.5 MG tablet Take by mouth. 09/25/18   [provider]  gabapentin (NEURONTIN) 300 MG capsule TAKE 1 CAPSULE BY MOUTH EVERY NIGHT 12/16/21   [provider]  glipiZIDE (GLUCOTROL XL) 10 MG 24 hr tablet Take 20 mg by mouth daily. 02/12/17   [provider]  hydrochlorothiazide (HYDRODIURIL) 25 MG tablet Take 25 mg by mouth daily. 01/30/17  [provider]  losartan (COZAAR) 100 MG tablet Take 100 mg by mouth daily. 01/30/17   [provider]  lovastatin (MEVACOR) 10 MG tablet Take by mouth. 04/23/19 04/22/20  [provider]  medroxyPROGESTERone (PROVERA) 2.5 MG tablet TAKE 1 TABLET BY MOUTH EVERY DAY 10/29/17   [provider]  methylPREDNISolone  (MEDROL  DOSEPAK) 4 MG TBPK tablet Take according to the package insert. 02/14/24   Bernardino Ditch, NP  polyethylene glycol (MIRALAX ) packet Take 17 g by mouth daily as needed for mild constipation or moderate constipation. 09/14/17   Yvonna Alm Cough, MD  Semaglutide, 2 MG/DOSE, 8 MG/3ML SOPN Inject into the skin. 12/19/21   [provider]  sertraline (ZOLOFT) 50 MG tablet Take by mouth. 04/22/19 04/21/20   [provider]  triamterene-hydrochlorothiazide (MAXZIDE-25) 37.5-25 MG tablet Take 1 tablet by mouth daily. 03/06/22   [provider]  vitamin B-12 (CYANOCOBALAMIN) 1000 MCG tablet Take 1 tablet daily for Vitamin B12 Deficiency. 04/23/19   [provider]  Insulin Glargine (BASAGLAR KWIKPEN [place]) Inject 40 Units into the skin at bedtime.  05/07/19  [provider]    Family History Family History  Problem Relation Age of Onset   Stroke Mother    Diabetes Mother    Hypertension Mother    Emphysema Father    Diabetes Father    Hypertension Father    Breast cancer Neg Hx     Social History Social History   Tobacco Use   Smoking status: Never   Smokeless tobacco: Never  Vaping Use   Vaping status: Never Used  Substance Use Topics   Alcohol use: Not Currently    Comment: rarely   Drug use: No     Allergies   Insulin glargine, Peanut oil, Metformin, Aleve [naproxen sodium], Latex, and Sulfamethoxazole-trimethoprim   Review of Systems Review of Systems  HENT:  Positive for congestion.   Respiratory:  Positive for cough.      Physical Exam Triage Vital Signs ED Triage Vitals  Encounter Vitals Group     BP 06/16/24 0920 117/82     Girls Systolic BP Percentile --      Girls Diastolic BP Percentile --      Boys Systolic BP Percentile --      Boys Diastolic BP Percentile --      Pulse Rate 06/16/24 0920 73     Resp 06/16/24 0920 16     Temp 06/16/24 0920 98.8 F (37.1 C)     Temp Source 06/16/24 0920 Oral     SpO2 06/16/24 0920 97 %     Weight 06/16/24 0918 180 lb 6.4 oz (81.8 kg)     Height --      Head Circumference --      Peak Flow --      Pain Score 06/16/24 0919 0     Pain Loc --      Pain Education --      Exclude from Growth Chart --    No data found.  Updated Vital Signs BP 117/82 (BP Location: Left Arm)   Pulse 73   Temp 98.8 F (37.1 C) (Oral)   Resp 16   Wt 180 lb 6.4 oz (81.8 kg)   SpO2 97%   BMI 31.21  kg/m   Visual Acuity Right Eye Distance:   Left Eye Distance:   Bilateral Distance:    Right Eye Near:   Left Eye Near:    Bilateral Near:  Physical Exam Vitals and nursing note reviewed.  Constitutional:      General: She is not in acute distress.    Appearance: She is well-developed. She is not ill-appearing.  HENT:     Head: Normocephalic and atraumatic.     Right Ear: Tympanic membrane and ear canal normal.     Left Ear: Tympanic membrane and ear canal normal.     Nose: Congestion present.     Mouth/Throat:     Mouth: Mucous membranes are moist.     Pharynx: Oropharynx is clear. Uvula midline. No oropharyngeal exudate or posterior oropharyngeal erythema.     Tonsils: No tonsillar exudate or tonsillar abscesses.  Eyes:     Conjunctiva/sclera: Conjunctivae normal.     Pupils: Pupils are equal, round, and reactive to light.  Cardiovascular:     Rate and Rhythm: Normal rate and regular rhythm.     Heart sounds: Normal heart sounds.  Pulmonary:     Effort: Pulmonary effort is normal.     Breath sounds: Normal breath sounds. No wheezing, rhonchi or rales.  Musculoskeletal:     Cervical back: Normal range of motion and neck supple.  Lymphadenopathy:     Cervical: No cervical adenopathy.  Skin:    General: Skin is warm and dry.  Neurological:     General: No focal deficit present.     Mental Status: She is alert and oriented to person, place, and time.  Psychiatric:        Mood and Affect: Mood normal.        Behavior: Behavior normal.      UC Treatments / Results  Labs (all labs ordered are listed, but only abnormal results are displayed) Labs Reviewed - No data to display Basic Metabolic Panel (BMP) Order: [id number] Component Ref Range & Units 5 mo ago  Glucose 70 - 110 mg/dL 888 High   Sodium 863 - 145 mmol/L 140  Potassium 3.6 - 5.1 mmol/L 4.7  Chloride 97 - 109 mmol/L 107  Carbon Dioxide (CO2) 22.0 - 32.0 mmol/L 24.6  Calcium 8.7 - 10.3 mg/dL  89.8  Urea Nitrogen (BUN) 7 - 25 mg/dL 23  Creatinine 0.6 - 1.1 mg/dL 1.3 High   Glomerular Filtration Rate (eGFR) >60 mL/min/1.73sq m 48 Low   Comment: CKD-EPI (2021) does not include patient's race in the calculation of eGFR.  Monitoring changes of plasma creatinine and eGFR over time is useful for monitoring kidney function.  Interpretive Ranges for eGFR (CKD-EPI 2021):  eGFR:       >60 mL/min/1.73 sq. m - Normal eGFR:       30-59 mL/min/1.73 sq. m - Moderately Decreased eGFR:       15-29 mL/min/1.73 sq. m  - Severely Decreased eGFR:       < 15 mL/min/1.73 sq. m  - Kidney Failure   Note: These eGFR calculations do not apply in acute situations when eGFR is changing rapidly or patients on dialysis.  BUN/Crea Ratio 6.0 - 20.0 17.7  Anion Gap w/K 6.0 - 16.0 13.1  Resulting Agency [facility] - LAB   Specimen Collected: 01/03/24 09:57   Performed by: [facility] - LAB Last Resulted: 01/03/24 16:04  Received From: [facility]  Result Received: 02/11/24 13:40   EKG   Radiology No results found.  Procedures Procedures (including critical care time)  Medications Ordered in UC Medications - No data to display  Initial Impression / Assessment and Plan / UC Course  I have reviewed the triage vital signs and the nursing notes.  Pertinent labs & imaging results that were available during my care of the patient were reviewed by me and considered in my medical decision making (see chart for details).     Reviewed exam and symptoms with patient.  No red flags.  Discussed bronchitis.  Promethazine DM as needed for cough, side effect profile reviewed.  Provisional prescription for azithromycin provided with instruction not to take unless symptoms do not improve or worsen by December 4.  Encouraged rest fluids and PCP follow-up if symptoms do not improve.  ER precautions reviewed. Final Clinical Impressions(s) / UC Diagnoses   Final diagnoses:   Bronchitis     Discharge Instructions      You may start Promethazine DM as needed for your cough.  Please note this medication will make you drowsy.  Do not drink alcohol or drive on this medication.  Provisional prescription for azithromycin antibiotic has been provided.  Please do not take it less your symptoms do not improve or worsen by December 4.  Lots of rest and fluids.  Please follow-up with your PCP if your symptoms do not improve.  Please go to the ER for any worsening symptoms.  I hope you feel better soon!     ED Prescriptions     Medication Sig Dispense Auth. Provider   promethazine-dextromethorphan (PROMETHAZINE-DM) 6.25-15 MG/5ML syrup Take 5 mLs by mouth 4 (four) times daily as needed for cough. 118 mL Shyia Fillingim, Jodi R, NP   azithromycin (ZITHROMAX) 250 MG tablet Take 1 tablet (250 mg total) by mouth daily. Take first 2 tablets together, then 1 every day until finished. 6 tablet Odalys Win, Jodi R, NP      PDMP not reviewed this encounter.   Loreda Myla SAUNDERS, NP 06/16/24 [phone]

## 2024-07-06 ENCOUNTER — Emergency Department
Admission: EM | Admit: 2024-07-06 | Discharge: 2024-07-06 | Disposition: A | Discharge status: Home / Self Care | Attending: Emergency Medicine | Admitting: Emergency Medicine

## 2024-07-06 ENCOUNTER — Other Ambulatory Visit: Payer: Self-pay

## 2024-07-06 ENCOUNTER — Emergency Department

## 2024-07-06 DIAGNOSIS — E119 Type 2 diabetes mellitus without complications: Secondary | ICD-10-CM | POA: Insufficient documentation

## 2024-07-06 DIAGNOSIS — N179 Acute kidney failure, unspecified: Secondary | ICD-10-CM | POA: Insufficient documentation

## 2024-07-06 DIAGNOSIS — R0789 Other chest pain: Secondary | ICD-10-CM | POA: Insufficient documentation

## 2024-07-06 DIAGNOSIS — I10 Essential (primary) hypertension: Secondary | ICD-10-CM | POA: Diagnosis not present

## 2024-07-06 DIAGNOSIS — R079 Chest pain, unspecified: Secondary | ICD-10-CM

## 2024-07-06 LAB — BASIC METABOLIC PANEL WITH GFR
Anion gap: 14 (ref 5–15)
BUN: 18 mg/dL (ref 6–20)
CO2: 21 mmol/L — ABNORMAL LOW (ref 22–32)
Calcium: 9 mg/dL (ref 8.9–10.3)
Chloride: 104 mmol/L (ref 98–111)
Creatinine, Ser: 1.22 mg/dL — ABNORMAL HIGH (ref 0.44–1.00)
GFR, Estimated: 51 mL/min — ABNORMAL LOW
Glucose, Bld: 315 mg/dL — ABNORMAL HIGH (ref 70–99)
Potassium: 4 mmol/L (ref 3.5–5.1)
Sodium: 138 mmol/L (ref 135–145)

## 2024-07-06 LAB — CBC
HCT: 41.6 % (ref 36.0–46.0)
Hemoglobin: 13.1 g/dL (ref 12.0–15.0)
MCH: 27.5 pg (ref 26.0–34.0)
MCHC: 31.5 g/dL (ref 30.0–36.0)
MCV: 87.4 fL (ref 80.0–100.0)
Platelets: 287 K/uL (ref 150–400)
RBC: 4.76 MIL/uL (ref 3.87–5.11)
RDW: 13.2 % (ref 11.5–15.5)
WBC: 7.7 K/uL (ref 4.0–10.5)
nRBC: 0 % (ref 0.0–0.2)

## 2024-07-06 LAB — TROPONIN T, HIGH SENSITIVITY
Troponin T High Sensitivity: <15 ng/L (ref 0–19)
Troponin T High Sensitivity: <15 ng/L (ref 0–19)

## 2024-07-06 MED ORDER — SODIUM CHLORIDE 0.9 % IV BOLUS
500.0000 mL | Freq: Once | INTRAVENOUS | Status: AC
  Administered 2024-07-06: 500 mL via INTRAVENOUS

## 2024-07-06 NOTE — ED Triage Notes (Signed)
 Pt comes via EMS from home with cp that started at 7am. Pt states worse at 8am. Pt not feeling well and tired. Pt states mid sternal to shoulder pain. Pt given 6 of nitroglycerin. Pt states pain down to  5/10  150/70 to 130/70 HR 100s 95% Ra

## 2024-07-06 NOTE — ED Provider Notes (Signed)
 [facility] Provider Note    Event Date/Time   First MD Initiated Contact with Patient 07/06/24 1612     (approximate)   History   Chest Pain   HPI  Caitlyn Walton is a 58 y.o. female with history of diabetes, hypertension, presenting with chest pain.  States started this morning.  She states that she cooked breakfast, going to sit down and was starting to eat when she felt the pressure left-sided chest, radiated to her back.  She denies any numbness tingling, chest pain was not tearing.  Denies any shortness of breath, no leg swelling.  Denies any recent travel or surgeries, no history of malignancies or hormone use.  No unilateral calf swelling or tenderness.  States No Prior History of MI.  No History of CHF or Blood Clots.  States that she has not seen a cardiologist in a long time.  Patient states that chest pain has improved at this time.  States that she was getting over a recent cold, still has some mild residual cough.  Per independent history from EMS, she was given full dose aspirin and nitro.  On independent chart review, she was seen by primary care in mid December, has history of acid reflux, history of hyperlipidemia.     Physical Exam   Triage Vital Signs: ED Triage Vitals  Encounter Vitals Group     BP 07/06/24 1228 130/72     Girls Systolic BP Percentile --      Girls Diastolic BP Percentile --      Boys Systolic BP Percentile --      Boys Diastolic BP Percentile --      Pulse Rate 07/06/24 1228 (!) 101     Resp 07/06/24 1228 18     Temp 07/06/24 1228 98 F (36.7 C)     Temp src --      SpO2 07/06/24 1228 99 %     Weight 07/06/24 1226 185 lb (83.9 kg)     Height 07/06/24 1226 5' 3 (1.6 m)     Head Circumference --      Peak Flow --      Pain Score 07/06/24 1226 5     Pain Loc --      Pain Education --      Exclude from Growth Chart --     Most recent vital signs: Vitals:   07/06/24 1651 07/06/24 1652  BP:  (!) 145/91   Pulse: 83   Resp: 18   Temp:  97.9 F (36.6 C)  SpO2: 100%      General: Awake, no distress.  CV:  Good peripheral perfusion.  Resp:  Normal effort.  No tachypnea or respiratory distress Abd:  No distention.  Soft nontender Other:  Equal radial pulses, no focal weakness or numbness, no unilateral calf swelling Or Tenderness   ED Results / Procedures / Treatments   Labs (all labs ordered are listed, but only abnormal results are displayed) Labs Reviewed  BASIC METABOLIC PANEL WITH GFR - Abnormal; Notable for the following components:      Result Value   CO2 21 (*)    Glucose, Bld 315 (*)    Creatinine, Ser 1.22 (*)    GFR, Estimated 51 (*)    All other components within normal limits  CBC  TROPONIN T, HIGH SENSITIVITY  TROPONIN T, HIGH SENSITIVITY     EKG  EKG shows, sinus rhythm, rate of 96, normal QS, normal QTc, no  obvious ischemic ST elevation, baseline is wandering due to patient movement but no obvious ischemic ST elevation, T wave flattening to 3, V3, T wave change to 3 is new compared to prior  Repeat EKG, sinus regarding 94, normal QS, normal QTc, no obvious ischemic ST elevation, there are some artifact due to patient movement, T wave flattening in 3, aVL, V3, not significantly changed compared to prior   RADIOLOGY On my independent interpretation, chest x-ray without obvious consolidation   PROCEDURES:  Critical Care performed: No  Procedures   MEDICATIONS ORDERED IN ED: Medications  sodium chloride  0.9 % bolus 500 mL (500 mLs Intravenous New Bag/Given 07/06/24 1652)     IMPRESSION / MDM / ASSESSMENT AND PLAN / ED COURSE  I reviewed the triage vital signs and the nursing notes.                              Differential diagnosis includes, but is not limited to, angina, ACS, acid reflux, GERD, musculoskeletal pain, strain.  Did consider dissection but less likely as patient's pain is not sharp or tearing, she is not overtly  hypertensive, she is equal radial pulses and no other neurological complaints.  Did also consider PE but she has no other risk factors for it, not hypoxic, not tachypneic, no shortness of breath.  Will get labs, EKG, troponin, chest x-ray.  Patient's presentation is most consistent with acute presentation with potential threat to life or bodily function.  Independent interpretation of labs and imaging below.  Troponin x 2 is not elevated, patient states that chest pain is significantly proved compared to prior.  No shortness of breath, she is well-appearing.  Did discuss with her about imaging lab results include incidental findings of her AKI.  Given fluids.  Discussed with her about outpatient cardiology follow-up, will place a referral.  Also discussed with her about following up primary care to get reassessed and to get repeat labs done to make sure that her creatinine is improving.  Encouraged hydration.  Considered but no indication for inpatient admission at this time, she is safe for outpatient management.  Will discharge with strict return precautions.  Shared decision making done with patient and she is agreeable with plan.    Clinical Course as of 07/06/24 1739  Sun Jul 06, 2024  1613 DG Chest 2 View 1. No acute cardiopulmonary abnormality. Multi  [TT]  [id number] Independent review of labs, troponins not elevated, no leukocytosis, electrolytes also really deranged, creatinine is mildly elevated.  Will give her some fluids here. [TT]  1646 Troponin T, High Sensitivity Troponins not elevated. [TT]    Clinical Course User Index [TT] Waymond, Lorelle Cummins, MD     FINAL CLINICAL IMPRESSION(S) / ED DIAGNOSES   Final diagnoses:  Chest pain, unspecified type  AKI (acute kidney injury)     Rx / DC Orders   ED Discharge Orders          Ordered    Ambulatory referral to Cardiology       Comments: If you have not heard from the Cardiology office within the next 72 hours please call [phone].    07/06/24 1646             Note:  This document was prepared using Dragon voice recognition software and may include unintentional dictation errors.    Waymond Lorelle Cummins, MD 07/06/24 [phone]

## 2024-07-06 NOTE — Discharge Instructions (Addendum)
 Please ensure to follow-up with cardiology for further management of your chest pain.  Please patient to see her primary care doctor this week to get reassessed.  Your creatinine was noted to be mildly elevated today, please continue to keep yourself hydrated and see your primary care doctor this week to get repeat labs done to make sure that your kidney function is improving.

## 2024-07-13 DIAGNOSIS — R079 Chest pain, unspecified: Secondary | ICD-10-CM | POA: Insufficient documentation

## 2024-07-13 NOTE — Progress Notes (Unsigned)
 Cardiology Office Note  Date:  07/15/2024   ID:  Caitlyn Walton, DOB 11/14/1965, MRN [id number]  PCP:  Steva Clotilda DEL, NP   Chief Complaint  Patient presents with   New Patient (Initial Visit)    Ref by [facility] ER for chest pain. Patient c/o chest discomfort that comes and goes.     HPI:  Caitlyn Walton is a 58 y.o. female with past medical history of: Past Medical History:  Diagnosis Date   Diabetes mellitus without complication (HCC)    Hypertension   A1c 9.1 Who presents by referral from Dr. Waymond for consultation of her chest pain  Sunday morning 7 Am, got up, felt tired, cooked breakfast, going to sit down and was starting to eat when she felt the pressure left-sided chest, radiated to her back Called EMS, given aspirin and nitro, chest pain resolved in the ER Seen in the emergency room July 06, 2024 for chest pain Records requested and reviewed Cardiac enzymes negative, EKG nonacute  Little bit of discomfort in the same location when lying on the left side in bed Sleeps on that side, feels it might be musculoskeletal  Prior CT angio chest September 2018 At the time having chest pain, back pain Images pulled up and reviewed, no aortic atherosclerosis, no coronary calcification  Lab work reviewed A1c 9.1 Total cholesterol 176 LDL 106 Normal CMP  EKG personally reviewed by myself on todays visit EKG Interpretation Date/Time:  Tuesday July 15 2024 09:08:31 EST Ventricular Rate:  83 PR Interval:  134 QRS Duration:  80 QT Interval:  366 QTC Calculation: 430 R Axis:   -3  Text Interpretation: Normal sinus rhythm When compared with ECG of 06-Jul-2024 17:35, Nonspecific T wave abnormality no longer evident in Lateral leads Confirmed by Perla Lye [phone]) on 07/15/2024 9:11:39 AM    PMH:   has a past medical history of Diabetes mellitus without complication (HCC) and Hypertension.   PSH:    Past Surgical History:  Procedure  Laterality Date   CARPAL TUNNEL RELEASE     CESAREAN SECTION     ELBOW SURGERY     FINGER SURGERY      Current Outpatient Medications  Medication Sig Dispense Refill   Calcium Carbonate-Vitamin D (OYSTER SHELL CALCIUM 500 + D) 500-125 MG-UNIT TABS Take by mouth.     Cholecalciferol (VITAMIN D3) 50 MCG (2000 UT) capsule Take 3 capsules daily for 3 months, then reduce to 1 capsule daily thereafter for Vitamin D Deficiency.     clobetasol ointment (TEMOVATE) 0.05 % Apply 1 Application topically 2 (two) times daily.     clotrimazole-betamethasone (LOTRISONE) cream Apply 1 Application topically 2 (two) times daily.     estradiol (ESTRACE) 0.1 MG/GM vaginal cream Place vaginally.     gabapentin (NEURONTIN) 300 MG capsule TAKE 1 CAPSULE BY MOUTH EVERY NIGHT     glipiZIDE (GLUCOTROL XL) 10 MG 24 hr tablet Take 20 mg by mouth daily.  3   ibuprofen (ADVIL) 600 MG tablet Take 1 tablet by mouth 3 (three) times daily as needed.     losartan (COZAAR) 100 MG tablet Take 100 mg by mouth daily.  3   medroxyPROGESTERone (PROVERA) 2.5 MG tablet TAKE 1 TABLET BY MOUTH EVERY DAY     pioglitazone (ACTOS) 15 MG tablet Take 1 tablet (15 mg total) by mouth once daily     polyethylene glycol (MIRALAX ) packet Take 17 g by mouth daily as needed for mild constipation or moderate constipation. 14 each 0  promethazine -dextromethorphan (PROMETHAZINE -DM) 6.25-15 MG/5ML syrup Take 5 mLs by mouth 4 (four) times daily as needed for cough. 118 mL 0   Semaglutide, 2 MG/DOSE, 8 MG/3ML SOPN Inject into the skin.     triamterene-hydrochlorothiazide (MAXZIDE-25) 37.5-25 MG tablet Take 1 tablet by mouth daily.     vitamin B-12 (CYANOCOBALAMIN) 1000 MCG tablet Take 1 tablet daily for Vitamin B12 Deficiency.     lovastatin (MEVACOR) 10 MG tablet Take by mouth. (Patient not taking: Reported on 07/15/2024)     methylPREDNISolone  (MEDROL  DOSEPAK) 4 MG TBPK tablet Take according to the package insert. (Patient not taking: Reported on  07/15/2024) 1 each 0   sertraline (ZOLOFT) 50 MG tablet Take by mouth. (Patient not taking: Reported on 07/15/2024)     No current facility-administered medications for this visit.    Allergies:   Dog epithelium (canis lupus familiaris), Insulin glargine, Peanut oil, Empagliflozin, Gabapentin, Metformin, Aleve [naproxen sodium], Latex, Naproxen, and Sulfamethoxazole-trimethoprim   Social History:  The patient  reports that she has never smoked. She has never used smokeless tobacco. She reports that she does not currently use alcohol. She reports that she does not use drugs.   Family History:   family history includes Diabetes in her father and mother; Emphysema in her father; Hypertension in her father and mother; Stroke in her mother.    Review of Systems: Review of Systems  Constitutional: Negative.   HENT: Negative.    Respiratory: Negative.    Cardiovascular: Negative.   Gastrointestinal: Negative.   Musculoskeletal: Negative.   Neurological: Negative.   Psychiatric/Behavioral: Negative.    All other systems reviewed and are negative.    PHYSICAL EXAM: VS:  BP 120/80 (BP Location: Right Arm, Patient Position: Sitting, Cuff Size: Normal)   Pulse 83   Ht 5' 3 (1.6 m)   Wt 185 lb 2 oz (84 kg)   SpO2 98%   BMI 32.79 kg/m  , BMI Body mass index is 32.79 kg/m. GEN: Well nourished, well developed, in no acute distress HEENT: normal Neck: no JVD, carotid bruits, or masses Cardiac: RRR; no murmurs, rubs, or gallops,no edema  Respiratory:  clear to auscultation bilaterally, normal work of breathing GI: soft, nontender, nondistended, + BS MS: no deformity or atrophy Skin: warm and dry, no rash Neuro:  Strength and sensation are intact Psych: euthymic mood, full affect    Recent Labs: 07/06/2024: BUN 18; Creatinine, Ser 1.22; Hemoglobin 13.1; Platelets 287; Potassium 4.0; Sodium 138    Lipid Panel No results found for: CHOL, HDL, LDLCALC, TRIG    Wt Readings  from Last 3 Encounters:  07/15/24 185 lb 2 oz (84 kg)  07/06/24 185 lb (83.9 kg)  06/16/24 180 lb 6.4 oz (81.8 kg)       ASSESSMENT AND PLAN:  Problem List Items Addressed This Visit       Cardiology Problems   Hypercholesterolemia   Hypertension   Relevant Orders   EKG 12-Lead (Completed)     Other   Chest pain of uncertain etiology - Primary   Relevant Orders   EKG 12-Lead (Completed)   Diabetes (HCC)   Chest pain on left Atypical features, presented at rest after eating Resolved after several hours She is concerned it might be musculoskeletal given she has had low-grade symptoms in the same location when lying on her left side in bed  ER workup negative - Recommend screening study with calcium scoring for risk stratification  Hyperlipidemia Unable to tolerate statin Recommend she start Zetia  10 mg daily  Essential hypertension Blood pressure is well controlled on today's visit. No changes made to the medications.  Diabetes type 2 A1c elevated, reports having cortisone shot in her shoulder which caused A1c to climb Currently on Ozempic, watching her diet Recommend regular walking program  Seen in consultation for Dr. Lorry. Waymond and will be referred back to their office for ongoing care of the issues detailed above  Signed, Velinda Lunger, M.D., Ph.D. [facility] [place], [place] [phone]

## 2024-07-15 ENCOUNTER — Ambulatory Visit: Attending: Cardiovascular Disease | Admitting: Cardiovascular Disease

## 2024-07-15 VITALS — BP 120/80 | HR 83 | Ht 63.0 in | Wt 185.1 lb

## 2024-07-15 DIAGNOSIS — E7800 Pure hypercholesterolemia, unspecified: Secondary | ICD-10-CM | POA: Diagnosis not present

## 2024-07-15 DIAGNOSIS — R079 Chest pain, unspecified: Secondary | ICD-10-CM

## 2024-07-15 DIAGNOSIS — I10 Essential (primary) hypertension: Secondary | ICD-10-CM | POA: Diagnosis not present

## 2024-07-15 DIAGNOSIS — E119 Type 2 diabetes mellitus without complications: Secondary | ICD-10-CM

## 2024-07-15 MED ORDER — EZETIMIBE 10 MG PO TABS: 10.0000 mg | ORAL_TABLET | Freq: Every day | ORAL | 3 refills | Status: AC

## 2024-07-15 NOTE — Patient Instructions (Addendum)
 Medication Instructions:  Please start zetia 10 mg daily  If you need a refill on your cardiac medications before your next appointment, please call your pharmacy.   Lab work: No new labs needed  Testing/Procedures: CT coronary calcium score: chest pain  CT coronary calcium score.   - $99 out of pocket cost at the time of your test - Call [phone] to schedule at your convenience.  [facility] [address] [place] [address] [place], [place] [address]   Coronary CalciumScan A coronary calcium scan is an imaging test used to look for deposits of calcium and other fatty materials (plaques) in the inner lining of the blood vessels of the heart (coronary arteries). These deposits of calcium and plaques can partly clog and narrow the coronary arteries without producing any symptoms or warning signs. This puts a person at risk for a heart attack. This test can detect these deposits before symptoms develop. Tell a health care provider about: Any allergies you have. All medicines you are taking, including vitamins, herbs, eye drops, creams, and over-the-counter medicines. Any problems you or family members have had with anesthetic medicines. Any blood disorders you have. Any surgeries you have had. Any medical conditions you have. Whether you are pregnant or may be pregnant. What are the risks? Generally, this is a safe procedure. However, problems may occur, including: Harm to a pregnant woman and her unborn baby. This test involves the use of radiation. Radiation exposure can be dangerous to a pregnant woman and her unborn baby. If you are pregnant, you generally should not have this procedure done. Slight increase in the risk of cancer. This is because of the radiation involved in the test. What happens before the procedure? No preparation is needed for this procedure. What happens during the procedure? You will undress and remove any jewelry around your  neck or chest. You will put on a hospital gown. Sticky electrodes will be placed on your chest. The electrodes will be connected to an electrocardiogram (ECG) machine to record a tracing of the electrical activity of your heart. A CT scanner will take pictures of your heart. During this time, you will be asked to lie still and hold your breath for 2-3 seconds while a picture of your heart is being taken. The procedure may vary among health care providers and hospitals. What happens after the procedure? You can get dressed. You can return to your normal activities. It is up to you to get the results of your test. Ask your health care provider, or the department that is doing the test, when your results will be ready. Summary A coronary calcium scan is an imaging test used to look for deposits of calcium and other fatty materials (plaques) in the inner lining of the blood vessels of the heart (coronary arteries). Generally, this is a safe procedure. Tell your health care provider if you are pregnant or may be pregnant. No preparation is needed for this procedure. A CT scanner will take pictures of your heart. You can return to your normal activities after the scan is done. This information is not intended to replace advice given to you by your health care provider. Make sure you discuss any questions you have with your health care provider. Document Released: 12/30/2007 Document Revised: 05/22/2016 Document Reviewed: 05/22/2016 Elsevier Interactive Patient Education  2017 [employer].   Follow-Up: At [facility], you and your health needs are our priority.  As part of our continuing mission to  provide you with exceptional heart care, we have created designated Provider Care Teams.  These Care Teams include your primary Cardiologist (physician) and Advanced Practice Providers (APPs -  Physician Assistants and Nurse Practitioners) who all work together to provide you with the care you need,  when you need it.  You will need a follow up appointment as needed  Providers on your designated Care Team:   Lonni Meager, NP Bernardino Bring, PA-C Cadence [place], [place]  COVID-19 Vaccine Information can be found at: [email or web address] For questions related to vaccine distribution or appointments, please email [email or web address] or call [phone].

## 2024-07-24 ENCOUNTER — Ambulatory Visit
Admission: RE | Admit: 2024-07-24 | Discharge: 2024-07-24 | Disposition: A | Discharge status: Home / Self Care | Payer: Self-pay | Source: Ambulatory Visit | Attending: Cardiovascular Disease | Admitting: Cardiovascular Disease

## 2024-07-24 DIAGNOSIS — R079 Chest pain, unspecified: Secondary | ICD-10-CM | POA: Insufficient documentation

## 2024-07-24 DIAGNOSIS — E119 Type 2 diabetes mellitus without complications: Secondary | ICD-10-CM | POA: Insufficient documentation

## 2024-07-24 DIAGNOSIS — E7800 Pure hypercholesterolemia, unspecified: Secondary | ICD-10-CM | POA: Insufficient documentation

## 2024-07-24 DIAGNOSIS — I10 Essential (primary) hypertension: Secondary | ICD-10-CM | POA: Insufficient documentation

## 2024-08-01 ENCOUNTER — Encounter: Payer: Self-pay | Admitting: Emergency Medicine

## 2024-08-01 ENCOUNTER — Ambulatory Visit: Payer: Self-pay | Admitting: Cardiovascular Disease
